# Patient Record
Sex: Female | Born: 1975 | Race: Black or African American | Hispanic: No | Marital: Single | Smoking: Never smoker
Health system: Southern US, Community
[De-identification: ages and names within clinical notes are randomized; demographics above are authoritative.]

## PROBLEM LIST (undated history)

## (undated) DIAGNOSIS — Z860100 Personal history of colon polyps, unspecified: Secondary | ICD-10-CM

## (undated) DIAGNOSIS — F329 Major depressive disorder, single episode, unspecified: Secondary | ICD-10-CM

## (undated) DIAGNOSIS — S83242A Other tear of medial meniscus, current injury, left knee, initial encounter: Secondary | ICD-10-CM

## (undated) DIAGNOSIS — K5909 Other constipation: Secondary | ICD-10-CM

## (undated) DIAGNOSIS — F32A Depression, unspecified: Secondary | ICD-10-CM

## (undated) DIAGNOSIS — N393 Stress incontinence (female) (male): Secondary | ICD-10-CM

## (undated) DIAGNOSIS — I10 Essential (primary) hypertension: Secondary | ICD-10-CM

## (undated) HISTORY — DX: Major depressive disorder, single episode, unspecified: F32.9

## (undated) HISTORY — DX: Other tear of medial meniscus, current injury, left knee, initial encounter: S83.242A

## (undated) HISTORY — DX: Depression, unspecified: F32.A

## (undated) HISTORY — DX: Essential (primary) hypertension: I10

---

## 2001-03-24 ENCOUNTER — Other Ambulatory Visit: Admission: RE | Admit: 2001-03-24 | Discharge: 2001-03-24 | Payer: Self-pay | Admitting: Obstetrics and Gynecology

## 2005-10-19 ENCOUNTER — Ambulatory Visit: Payer: Self-pay | Admitting: Internal Medicine

## 2006-04-17 ENCOUNTER — Ambulatory Visit: Payer: Self-pay | Admitting: Internal Medicine

## 2006-04-17 LAB — CONVERTED CEMR LAB
ALT: 14 units/L (ref 0–40)
AST: 20 units/L (ref 0–37)
Albumin: 3.7 g/dL (ref 3.5–5.2)
BUN: 8 mg/dL (ref 6–23)
Bilirubin, Direct: 0.1 mg/dL (ref 0.0–0.3)
Eosinophils Relative: 1.2 % (ref 0.0–5.0)
GFR calc Af Amer: 106 mL/min
Glucose, Bld: 97 mg/dL (ref 70–99)
HDL: 43.1 mg/dL (ref >39.0)
Hemoglobin: 12.5 g/dL (ref 12.0–15.0)
Lymphocytes Relative: 26.2 % (ref 12.0–46.0)
MCHC: 34 g/dL (ref 30.0–36.0)
Monocytes Absolute: 0.4 10*3/uL (ref 0.2–0.7)
Monocytes Relative: 5.5 % (ref 3.0–11.0)
Neutrophils Relative %: 66.2 % (ref 43.0–77.0)
Platelets: 295 10*3/uL (ref 150–400)
Potassium: 3.8 meq/L (ref 3.5–5.1)
Sodium: 140 meq/L (ref 135–145)
Total CHOL/HDL Ratio: 3.7
Total Protein: 7 g/dL (ref 6.0–8.3)
WBC: 7.8 10*3/uL (ref 4.5–10.5)

## 2006-09-22 LAB — CONVERTED CEMR LAB: Pap Smear: NORMAL

## 2007-06-03 ENCOUNTER — Emergency Department (HOSPITAL_COMMUNITY): Admission: EM | Admit: 2007-06-03 | Discharge: 2007-06-03 | Payer: Self-pay | Admitting: Emergency Medicine

## 2007-06-22 ENCOUNTER — Ambulatory Visit: Payer: Self-pay | Admitting: Internal Medicine

## 2007-06-22 DIAGNOSIS — F329 Major depressive disorder, single episode, unspecified: Secondary | ICD-10-CM

## 2007-06-22 DIAGNOSIS — F419 Anxiety disorder, unspecified: Secondary | ICD-10-CM | POA: Insufficient documentation

## 2007-06-22 DIAGNOSIS — K219 Gastro-esophageal reflux disease without esophagitis: Secondary | ICD-10-CM | POA: Insufficient documentation

## 2007-06-22 DIAGNOSIS — R319 Hematuria, unspecified: Secondary | ICD-10-CM | POA: Insufficient documentation

## 2007-06-22 DIAGNOSIS — F32A Depression, unspecified: Secondary | ICD-10-CM | POA: Insufficient documentation

## 2007-06-22 LAB — CONVERTED CEMR LAB
Bacteria, UA: NONE SEEN
Beta hcg, urine, semiquantitative: NEGATIVE
Bilirubin Urine: NEGATIVE
RBC / HPF: NONE SEEN (ref <3)
Specific Gravity, Urine: <1.005
WBC, UA: NONE SEEN cells/hpf (ref <3)

## 2007-06-23 ENCOUNTER — Encounter: Payer: Self-pay | Admitting: Internal Medicine

## 2007-06-28 ENCOUNTER — Telehealth (INDEPENDENT_AMBULATORY_CARE_PROVIDER_SITE_OTHER): Payer: Self-pay | Admitting: *Deleted

## 2007-06-28 LAB — CONVERTED CEMR LAB: Chlamydia, DNA Probe: NEGATIVE

## 2007-07-04 ENCOUNTER — Telehealth (INDEPENDENT_AMBULATORY_CARE_PROVIDER_SITE_OTHER): Payer: Self-pay | Admitting: *Deleted

## 2007-07-13 ENCOUNTER — Ambulatory Visit: Payer: Self-pay | Admitting: *Deleted

## 2007-07-13 ENCOUNTER — Encounter: Payer: Self-pay | Admitting: Internal Medicine

## 2007-08-16 ENCOUNTER — Ambulatory Visit: Payer: Self-pay | Admitting: Internal Medicine

## 2007-08-20 ENCOUNTER — Encounter (INDEPENDENT_AMBULATORY_CARE_PROVIDER_SITE_OTHER): Payer: Self-pay | Admitting: *Deleted

## 2007-08-20 ENCOUNTER — Encounter: Payer: Self-pay | Admitting: Internal Medicine

## 2007-08-20 LAB — CONVERTED CEMR LAB
Hemoglobin: 12.6 g/dL (ref 12.0–15.0)
TSH: 1.05 microintl units/mL (ref 0.35–5.50)

## 2008-03-07 ENCOUNTER — Ambulatory Visit: Payer: Self-pay | Admitting: Family Medicine

## 2008-03-07 DIAGNOSIS — R55 Syncope and collapse: Secondary | ICD-10-CM | POA: Insufficient documentation

## 2008-03-12 ENCOUNTER — Encounter (INDEPENDENT_AMBULATORY_CARE_PROVIDER_SITE_OTHER): Payer: Self-pay | Admitting: *Deleted

## 2008-05-22 LAB — CONVERTED CEMR LAB: Pap Smear: NORMAL

## 2008-05-26 ENCOUNTER — Ambulatory Visit: Payer: Self-pay | Admitting: Internal Medicine

## 2008-05-26 DIAGNOSIS — G4700 Insomnia, unspecified: Secondary | ICD-10-CM | POA: Insufficient documentation

## 2008-05-29 ENCOUNTER — Encounter: Payer: Self-pay | Admitting: Internal Medicine

## 2008-06-13 ENCOUNTER — Telehealth (INDEPENDENT_AMBULATORY_CARE_PROVIDER_SITE_OTHER): Payer: Self-pay | Admitting: *Deleted

## 2008-06-14 ENCOUNTER — Ambulatory Visit (HOSPITAL_COMMUNITY): Admission: RE | Admit: 2008-06-14 | Discharge: 2008-06-14 | Payer: Self-pay | Admitting: Urology

## 2008-06-20 ENCOUNTER — Telehealth: Payer: Self-pay | Admitting: Internal Medicine

## 2008-06-23 ENCOUNTER — Encounter: Payer: Self-pay | Admitting: Internal Medicine

## 2008-07-24 ENCOUNTER — Ambulatory Visit (HOSPITAL_BASED_OUTPATIENT_CLINIC_OR_DEPARTMENT_OTHER): Admission: RE | Admit: 2008-07-24 | Discharge: 2008-07-24 | Payer: Self-pay | Admitting: Urology

## 2008-07-24 ENCOUNTER — Encounter (INDEPENDENT_AMBULATORY_CARE_PROVIDER_SITE_OTHER): Payer: Self-pay | Admitting: Urology

## 2008-07-24 HISTORY — PX: OTHER SURGICAL HISTORY: SHX169

## 2008-07-31 ENCOUNTER — Ambulatory Visit (HOSPITAL_COMMUNITY): Admission: RE | Admit: 2008-07-31 | Discharge: 2008-07-31 | Payer: Self-pay | Admitting: Urology

## 2008-08-06 ENCOUNTER — Ambulatory Visit (HOSPITAL_COMMUNITY): Admission: RE | Admit: 2008-08-06 | Discharge: 2008-08-06 | Payer: Self-pay | Admitting: Urology

## 2008-09-08 ENCOUNTER — Telehealth (INDEPENDENT_AMBULATORY_CARE_PROVIDER_SITE_OTHER): Payer: Self-pay | Admitting: *Deleted

## 2008-10-21 ENCOUNTER — Ambulatory Visit: Payer: Self-pay | Admitting: Internal Medicine

## 2008-10-22 ENCOUNTER — Encounter: Payer: Self-pay | Admitting: Internal Medicine

## 2008-10-29 ENCOUNTER — Telehealth: Payer: Self-pay | Admitting: Internal Medicine

## 2008-10-29 LAB — CONVERTED CEMR LAB
ALT: 20 units/L (ref 0–35)
AST: 28 units/L (ref 0–37)
Eosinophils Absolute: 0.2 10*3/uL (ref 0.0–0.7)
HDL: 38 mg/dL — ABNORMAL LOW (ref >39.00)
LDL Cholesterol: 80 mg/dL (ref 0–99)
MCHC: 34.2 g/dL (ref 30.0–36.0)
Monocytes Relative: 4.9 % (ref 3.0–12.0)
Neutro Abs: 5 10*3/uL (ref 1.4–7.7)
Neutrophils Relative %: 74.6 % (ref 43.0–77.0)
Platelets: 283 10*3/uL (ref 150.0–400.0)
RBC: 3.92 M/uL (ref 3.87–5.11)
RDW: 11.8 % (ref 11.5–14.6)
Total CHOL/HDL Ratio: 4
VLDL: 18.8 mg/dL (ref 0.0–40.0)

## 2008-10-31 ENCOUNTER — Ambulatory Visit: Payer: Self-pay | Admitting: Internal Medicine

## 2008-11-05 LAB — CONVERTED CEMR LAB: Ferritin: 7.3 ng/mL — ABNORMAL LOW (ref 10.0–291.0)

## 2009-02-16 ENCOUNTER — Telehealth: Payer: Self-pay | Admitting: Internal Medicine

## 2009-02-26 ENCOUNTER — Ambulatory Visit (HOSPITAL_COMMUNITY): Admission: RE | Admit: 2009-02-26 | Discharge: 2009-02-26 | Payer: Self-pay | Admitting: Orthopedic Surgery

## 2009-02-26 HISTORY — PX: OTHER SURGICAL HISTORY: SHX169

## 2009-04-14 ENCOUNTER — Telehealth: Payer: Self-pay | Admitting: Internal Medicine

## 2009-04-15 ENCOUNTER — Telehealth: Payer: Self-pay | Admitting: Internal Medicine

## 2009-04-29 ENCOUNTER — Encounter: Payer: Self-pay | Admitting: Internal Medicine

## 2009-06-05 ENCOUNTER — Telehealth (INDEPENDENT_AMBULATORY_CARE_PROVIDER_SITE_OTHER): Payer: Self-pay | Admitting: *Deleted

## 2009-06-09 ENCOUNTER — Encounter: Payer: Self-pay | Admitting: Internal Medicine

## 2009-10-19 ENCOUNTER — Ambulatory Visit: Payer: Self-pay | Admitting: Internal Medicine

## 2009-10-19 DIAGNOSIS — K5900 Constipation, unspecified: Secondary | ICD-10-CM | POA: Insufficient documentation

## 2009-10-19 LAB — CONVERTED CEMR LAB
Blood in Urine, dipstick: NEGATIVE
Ketones, urine, test strip: NEGATIVE
Specific Gravity, Urine: 1.02

## 2009-10-29 ENCOUNTER — Ambulatory Visit: Payer: Self-pay | Admitting: Internal Medicine

## 2009-10-30 ENCOUNTER — Encounter (INDEPENDENT_AMBULATORY_CARE_PROVIDER_SITE_OTHER): Payer: Self-pay | Admitting: *Deleted

## 2009-11-13 ENCOUNTER — Ambulatory Visit: Payer: Self-pay | Admitting: Internal Medicine

## 2009-12-02 ENCOUNTER — Telehealth: Payer: Self-pay | Admitting: Internal Medicine

## 2009-12-04 ENCOUNTER — Encounter: Payer: Self-pay | Admitting: Internal Medicine

## 2009-12-04 ENCOUNTER — Ambulatory Visit: Payer: Self-pay | Admitting: Internal Medicine

## 2009-12-04 ENCOUNTER — Ambulatory Visit (HOSPITAL_COMMUNITY): Admission: RE | Admit: 2009-12-04 | Discharge: 2009-12-04 | Payer: Self-pay | Admitting: Internal Medicine

## 2009-12-07 ENCOUNTER — Encounter: Payer: Self-pay | Admitting: Internal Medicine

## 2010-01-19 ENCOUNTER — Telehealth: Payer: Self-pay | Admitting: Internal Medicine

## 2010-01-20 ENCOUNTER — Telehealth: Payer: Self-pay | Admitting: Internal Medicine

## 2010-01-20 ENCOUNTER — Encounter: Payer: Self-pay | Admitting: Internal Medicine

## 2010-03-21 LAB — CONVERTED CEMR LAB
ALT: 14 units/L (ref 0–35)
AST: 21 units/L (ref 0–37)
Alkaline Phosphatase: 49 units/L (ref 39–117)
BUN: 6 mg/dL (ref 6–23)
Basophils Absolute: 0.1 10*3/uL (ref 0.0–0.1)
Beta hcg, urine, semiquantitative: NEGATIVE
Bilirubin, Direct: 0.2 mg/dL (ref 0.0–0.3)
CO2: 24 meq/L (ref 19–32)
CO2: 27 meq/L (ref 19–32)
Calcium: 9.5 mg/dL (ref 8.4–10.5)
Chloride: 102 meq/L (ref 96–112)
Chloride: 103 meq/L (ref 96–112)
Creatinine, Ser: 0.82 mg/dL (ref 0.40–1.20)
Eosinophils Absolute: 0.1 10*3/uL (ref 0.0–0.7)
Glucose, Bld: 75 mg/dL (ref 70–99)
Glucose, Bld: 88 mg/dL (ref 70–99)
HCT: 38 % (ref 36.0–46.0)
Hemoglobin: 12.1 g/dL (ref 12.0–15.0)
Hemoglobin: 13 g/dL (ref 12.0–15.0)
Iron: 80 ug/dL (ref 42–145)
Lymphocytes Relative: 30.2 % (ref 12.0–46.0)
MCV: 91.3 fL (ref 78.0–100.0)
Monocytes Absolute: 0.6 10*3/uL (ref 0.1–1.0)
Monocytes Absolute: 0.8 10*3/uL (ref 0.1–1.0)
Monocytes Relative: 6.5 % (ref 3.0–12.0)
Neutro Abs: 4.2 10*3/uL (ref 1.7–7.7)
Neutro Abs: 5.8 10*3/uL (ref 1.4–7.7)
Neutrophils Relative %: 48 % (ref 43–77)
Platelets: 282 10*3/uL (ref 150–400)
Potassium: 3.9 meq/L (ref 3.5–5.1)
Potassium: 4 meq/L (ref 3.5–5.3)
RBC: 3.99 M/uL (ref 3.87–5.11)
RDW: 14.1 % (ref 11.5–14.6)
Saturation Ratios: 24.3 % (ref 20.0–50.0)
Sodium: 138 meq/L (ref 135–145)
Sodium: 138 meq/L (ref 135–145)
TSH: 1.05 microintl units/mL (ref 0.35–5.50)
Total Bilirubin: 1 mg/dL (ref 0.3–1.2)
Transferrin: 235.2 mg/dL (ref 212.0–360.0)
WBC: 8.7 10*3/uL (ref 4.0–10.5)

## 2010-03-23 NOTE — Assessment & Plan Note (Signed)
Summary: constipation--ch.   History of Present Illness Visit Type: consult  Primary GI MD: Lina Sar MD Primary Provider: Willow Ora, MD  Requesting Provider: Willow Ora, MD  Chief Complaint: Lower abd pain, bloating, rectal pain, constipation, and nausea History of Present Illness:   This is a 35 year old African American female c/o  constipation. She underwent  right hip surgery in January of this year and returned back to work in August. The first week at work she became very constipated and did not have a bowel movement for almost 3 weeks. She resorted to manual desimpaction, and subsequently was given lactulose 30 cc 3 times a day by Dr. Drue Novel. She had a satisfactory bowel movement 3 days ago but has not gone since then. Her level of activity has decreased since the hip surgery. She is a Runner, broadcasting/film/video. She occasionally has taken  Epsom salts. There is no family history of colon cancer or bowel problems. She has never had any rectal surgery or vaginal delivery.   GI Review of Systems    Reports abdominal pain, bloating, nausea, and  vomiting.     Location of  Abdominal pain: lower abdomen.    Denies acid reflux, belching, chest pain, dysphagia with liquids, dysphagia with solids, heartburn, loss of appetite, vomiting blood, weight loss, and  weight gain.      Reports constipation, fecal incontinence, rectal bleeding, and  rectal pain.     Denies anal fissure, black tarry stools, change in bowel habit, diarrhea, diverticulosis, heme positive stool, hemorrhoids, irritable bowel syndrome, jaundice, light color stool, and  liver problems.    Current Medications (verified): 1)  Lunesta 3 Mg Tabs (Eszopiclone) .Marland Kitchen.. 1 By Mouth At Bedtime As Needed 2)  Trazodone Hcl 50 Mg Tabs (Trazodone Hcl) .Marland Kitchen.. 1 By Mouth Two Times A Day or Three Times A Day 3)  Meloxicam 15 Mg Tabs (Meloxicam) .... Qd 4)  Analpram-Hc 1-2.5 % Crea (Hydrocortisone Ace-Pramoxine) .... Apply Twice A Day For One Week 5)  Lactulose 20  Gm/88ml Soln (Lactulose) .... 20 Cc 3 Times A Day As Needed For Constipation 6)  Vitamin D (Ergocalciferol) 50000 Unit Caps (Ergocalciferol) .... One Capsule By Mouth Once A Week  Allergies (verified): No Known Drug Allergies  Past History:  Past Medical History: GO, no BC Anxiety, Depression insomnia GERD Back  and hip pain since 10-2006 , WC related injury, s/p local injection 02-2008 and 04-2008 wrist pain since 2008, states was injured at work, Rx trazodone  Urinary Tract Infection Obesity  Past Surgical History: Reviewed history from 10/19/2009 and no changes required. 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 CAD - no HTN - M. Aunts stroke - no DM - M, M. 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  The patient denies allergy/sinus, anemia, blood in urine, breast changes/lumps, change in vision, confusion, cough, coughing up blood, fainting, fever,  hearing problems, heart murmur, heart rhythm changes, muscle pains/cramps, night sweats, pregnancy symptoms, shortness of breath, sore throat, swelling of feet/legs, swollen lymph glands, thirst - excessive , urination - excessive , urination changes/pain, and vision changes.         Pertinent positive and negative review of systems were noted in the above HPI. All other ROS was  otherwise negative.   Vital Signs:  Patient profile:   35 year old female Height:      64.75 inches Weight:      188 pounds BMI:     31.64 BSA:     1.92 Pulse rate:   68 / minute Pulse rhythm:   regular BP sitting:   124 / 76  (left arm) Cuff size:   regular  Vitals Entered By: Ok Anis CMA (November 13, 2009 9:32 AM)  Physical Exam  General:  Well developed, well nourished, no acute distress. Eyes:  PERRLA, no icterus. Mouth:  No deformity or lesions, dentition normal. Neck:  normal. Lungs:  Clear throughout to auscultation. Heart:  Regular rate and rhythm; no murmurs, rubs,  or bruits. Abdomen:  soft relaxed nontender abdomen without palpable stool or mass. Bowel sounds are normoactive. There is no distention. Liver edge at costal margin. Rectal:  normal perianal area, rectal tone is normal, there is a large amount of soft Hemoccult negative stool. Extremities:  No clubbing, cyanosis, edema or deformities noted. Skin:  Intact without significant lesions or rashes. Psych:  Alert and cooperative. Normal mood and affect.   Impression & Recommendations:  Problem # 1:  CONSTIPATION (ICD-564.00)  Patient has chronic constipation with a recent exascerbation most likely related to her recent hip surgery , inactivity. and pain meds. She had to have a fecal disimpaction. She could not tolerate lactulose because of its  taste. We will switch her to MiraLax 17 g every day or every other day. We have discussed the constipation and treatment. She will try to increase her level of activity. I have given her samples of Align  to take daily. Because of a concern for a possible structural abnormality of her colon such as diverticulosis or obstruction, we will go ahead with a colonoscopy. She will use MiraLax prep.  Orders: Colonoscopy (Colon)  Patient Instructions: 1)  continue high-fiber diet. 2)  MiraLax 17 g daily. 3)  Align one p.o. q.d. 4)  Colonoscopy, MiraLax prep. 5)   Increase activity. 6)  Copy sent to : Dr Drue Novel 7)  The medication list was reviewed and reconciled.  All changed / newly prescribed medications were explained.  A complete medication list was provided to the patient / caregiver. Prescriptions: MIRALAX  POWD (POLYETHYLENE GLYCOL 3350) Dissolve 17 grams (1 capful) in at least 8 ounces water/juice and drink once daily  #527 grams x 3   Entered by:   Lamona Curl CMA (AAMA)   Authorized by:   Hart Carwin MD   Signed by:   Lamona Curl CMA (AAMA) on 11/13/2009   Method used:   Electronically to        [facility] [phone]* (retail)       [address]       [place], [place]  [address]       Ph: [phone]       Fax: [phone]   RxID:   [id number] DULCOLAX 5 MG  TBEC (BISACODYL) Day before procedure take 2 at 3pm and 2 at 8pm.  #  4 x 0   Entered by:   Lamona Curl CMA (AAMA)   Authorized by:   Hart Carwin MD   Signed by:   Lamona Curl CMA (AAMA) on 11/13/2009   Method used:   Electronically to        [facility] [phone]* (retail)       [address]       [place], [place]  [address]       Ph: [phone]       Fax: [phone]   RxID:   [id number] REGLAN 10 MG  TABS (METOCLOPRAMIDE HCL) As per prep instructions.  #2 x 0   Entered by:   Lamona Curl CMA (AAMA)   Authorized by:   Hart Carwin MD   Signed by:   Lamona Curl CMA (AAMA) on 11/13/2009   Method used:   Electronically to        [facility] [phone]* (retail)       [address]       [place], [place]  [address]       Ph: [phone]       Fax: [phone]   RxID:   [id number] MIRALAX   POWD (POLYETHYLENE GLYCOL 3350) As per prep  instructions.  #255gm x 0   Entered by:   Lamona Curl CMA (AAMA)   Authorized by:   Hart Carwin MD   Signed by:   Lamona Curl CMA (AAMA) on 11/13/2009   Method used:    Electronically to        [facility] [phone]* (retail)       [address]       [place], [place]  [address]       Ph: [phone]       Fax: [phone]   RxID:   [id number]

## 2010-03-23 NOTE — Medication Information (Signed)
Summary: Prior Authorization & Approval for Additional Quantity Lunesta/M  Prior Authorization & Approval for Additional Quantity Lunesta/[employer]   Imported By: Lanelle Bal 06/15/2009 13:49:17  _____________________________________________________________________  External Attachment:    Type:   Image     Comment:   External Document

## 2010-03-23 NOTE — Progress Notes (Signed)
Summary: refill  Phone Note Refill Request Message from:  Fax from Pharmacy on January 19, 2010 8:57 AM  Refills Requested: Medication #1:  LUNESTA 3 MG TABS 1 by mouth at bedtime as needed Ivar Bury -fax [phone]  Initial call taken by: Okey Regal [place],  January 19, 2010 8:57 AM  Follow-up for Phone Call        30, 6 refills Follow-up by: [facility] E. Zackariah Vanderpol MD,  January 19, 2010 4:48 PM    Prescriptions: LUNESTA 3 MG TABS (ESZOPICLONE) 1 by mouth at bedtime as needed  #30 x 6   Entered by:   Army Fossa CMA   Authorized by:   Nolon Rod. Eathel Pajak MD   Signed by:   Army Fossa CMA on 01/19/2010   Method used:   Printed then faxed to ...       [facility] [phone]* (retail)       [address]       [place], [place]  [address]       Ph: [phone]       Fax: [phone]   RxID:   [id number]

## 2010-03-23 NOTE — Progress Notes (Signed)
Summary: Prior auth APPROVED for Lunesta  Phone Note Call from Patient Call back at [facility] Phone [phone]   Caller: Patient Summary of Call: PATIENT DROPPED OFF LETTER FROM [employer] REGARDING RENEWAL OF LUNESTA 3 MG --COVERAGE EXPIRES 06/13/2009  PLEASE REVIEW AND CALL PATIENT AT [phone] WITH RESPONSE  WILL TAKE LETTER TO [facility] IN A PLASTIC SLEEVE Initial call taken by: Jerolyn Shin,  June 05, 2009 4:16 PM  Follow-up for Phone Call        A review of our records shows that approval for coverage of the medication Lunesta 3 mg is due to expire on 06/13/09.  [employer] called and prior  auth in process   CASE WJ#[id number].Kandice Hams  June 08, 2009 3:43 PM      Additional Follow-up for Phone Call Additional follow up Details #1::        Prior auth APPROVED  from 05/19/09 to 06/09/2010--[employer] Additional Follow-up by: Kandice Hams,  June 10, 2009 8:59 AM

## 2010-03-23 NOTE — Letter (Signed)
Summary: [facility] Instructions  [facility]  [address] [place], [place] [address]   Phone: [phone]  Fax: [phone]       Teresa Gilbert    06-Jun-1975    MRN: [id number]       Procedure Day /Date: Friday 11/27/09     Arrival Time: 12:30 pm     Procedure Time: 1:30 pm     Location of Procedure:                    _x _  [facility] (4th Floor)  PREPARATION FOR COLONOSCOPY WITH MIRALAX  Starting 5 days prior to your procedure 11/22/09 do not eat nuts, seeds, popcorn, corn, beans, peas,  salads, or any raw vegetables.  Do not take any fiber supplements (e.g. Metamucil, Citrucel, and Benefiber). ____________________________________________________________________________________________________   THE DAY BEFORE YOUR PROCEDURE         DATE:  11/26/09 DAY: Thursday  1   Drink clear liquids the entire day-NO SOLID FOOD  2   Do not drink anything colored red or purple.  Avoid juices with pulp.  No orange juice.  3   Drink at least 64 oz. (8 glasses) of fluid/clear liquids during the day to prevent dehydration and help the prep work efficiently.  CLEAR LIQUIDS INCLUDE: Water Jello Ice Popsicles Tea (sugar ok, no milk/cream) Powdered fruit flavored drinks Coffee (sugar ok, no milk/cream) Gatorade Juice: apple, white grape, white cranberry  Lemonade Clear bullion, consomm, broth Carbonated beverages (any kind) Strained chicken noodle soup Hard Candy  4   Mix the entire bottle of Miralax with 64 oz. of Gatorade/Powerade in the morning and put in the refrigerator to chill.  5   At 3:00 pm take 2 Dulcolax/Bisacodyl tablets.  6   At 4:30 pm take one Reglan/Metoclopramide tablet.  7  Starting at 5:00 pm drink one 8 oz glass of the Miralax mixture every 15-20 minutes until you have finished drinking the entire 64 oz.  You should finish drinking prep around 7:30 or 8:00 pm.  8   If you are nauseated, you may take the 2nd Reglan/Metoclopramide  tablet at 6:30 pm.        9    At 8:00 pm take 2 more DULCOLAX/Bisacodyl tablets.         THE DAY OF YOUR PROCEDURE      DATE:  11/27/09 DAY: Friday  You may drink clear liquids until 11:30 am (2 HOURS BEFORE PROCEDURE).   MEDICATION INSTRUCTIONS  Unless otherwise instructed, you should take regular prescription medications with a small sip of water as early as possible the morning of your procedure.       OTHER INSTRUCTIONS  You will need a responsible adult at least 37 years of age to accompany you and drive you home.   This person must remain in the waiting room during your procedure.  Wear loose fitting clothing that is easily removed.  Leave jewelry and other valuables at home.  However, you may wish to bring a book to read or an iPod/MP3 player to listen to music as you wait for your procedure to start.  Remove all body piercing jewelry and leave at home.  Total time from sign-in until discharge is approximately 2-3 hours.  You should go home directly after your procedure and rest.  You can resume normal activities the day after your procedure.  The day of your procedure you should not:   Drive   Make  legal decisions   Operate machinery   Drink alcohol   Return to work  You will receive specific instructions about eating, activities and medications before you leave.   The above instructions have been reviewed and explained to me by  Lamona Curl CMA Duncan Dull)  November 13, 2009 10:44 AM     I fully understand and can verbalize these instructions _____________________________ Date 11/13/09

## 2010-03-23 NOTE — Letter (Signed)
Summary: Patient Notice- Polyp Results  [facility]  [address] [place], [place] [address]   Phone: [phone]  Fax: [phone]        December 07, 2009 MRN: [id number]    [facility] [address] [place], [place]  [address]    Dear Ms. Razavi,  I am pleased to inform you that the colon polyp(s) removed during your recent colonoscopy was (were) found to be benign (no cancer detected) upon pathologic examination.The polyp was hyperplastic ( not precancerous)  I recommend you have a repeat colonoscopy examination in 10_ years to look for recurrent polyps, as having colon polyps increases your risk for having recurrent polyps or even colon cancer in the future.  Should you develop new or worsening symptoms of abdominal pain, bowel habit changes or bleeding from the rectum or bowels, please schedule an evaluation with either your primary care physician or with me.  Additional information/recommendations:  _x_ No further action with gastroenterology is needed at this time. Please      follow-up with your primary care physician for your other healthcare      needs.  __ Please call [phone] to schedule a return visit to review your      situation.  __ Please keep your follow-up visit as already scheduled.  __ Continue treatment plan as outlined the day of your exam.  Please call us if you are having persistent problems or have questions about your condition that have not been fully answered at this time.  Sincerely,  Hart Carwin MD  This letter has been electronically signed by your physician.  Appended Document: Patient Notice- Polyp Results Letter sent to patient.

## 2010-03-23 NOTE — Assessment & Plan Note (Signed)
Summary: cpx/fasting//kn   Vital Signs:  Patient profile:   35 year old female Height:      64.75 inches Weight:      193.13 pounds Pulse rate:   75 / minute Pulse rhythm:   regular BP sitting:   116 / 80  (left arm) Cuff size:   large  Vitals Entered By: Army Fossa CMA (October 29, 2009 9:44 AM) CC: CPX, fasting Comments declines flu shot [facility] [address]   History of Present Illness: CPX   Current Medications (verified): 1)  Lunesta 3 Mg Tabs (Eszopiclone) .Marland Kitchen.. 1 By Mouth At Bedtime As Needed 2)  Trazodone Hcl 50 Mg Tabs (Trazodone Hcl) .Marland Kitchen.. 1 By Mouth Daily 3)  Meloxicam 15 Mg Tabs (Meloxicam) .... Qd 4)  Phentermine Hcl 37.5 Mg Caps (Phentermine Hcl) .Marland Kitchen.. 1 A Day, Started 07-21-09, Rx At [facility] 5)  Analpram-Hc 1-2.5 % Crea (Hydrocortisone Ace-Pramoxine) .... Apply Twice A Day For One Week 6)  Lactulose 20 Gm/59ml Soln (Lactulose) .... 20 Cc 3 Times A Day As Needed For Constipation 7)  Vitamin D  Allergies (verified): No Known Drug Allergies  Past History:  Past Medical History: GO, no BC Anxiety, Depression insomnia GERD Back  and hip pain since 10-2006 , WC related injury, s/p local injection 02-2008 and 04-2008 wrist pain since 2008, states was injured at work, Rx trazodone   Past Surgical History: Reviewed history from 10/19/2009 and no changes required. 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 CAD - no HTN - M. Aunts stroke - no DM - M, M. Aunt, Uncle, cousin colon Ca - PGM, MGM (Dx in the 68s ant 44s) breast Ca - no dementia - PGF Lung cancer-- GF, G uncle   Social History: Reviewed history from 10/21/2008 and no changes required. 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 No GERD symptoms Chest pain seems to be triggered by anxiety, is not exertional. Resp:  Denies cough and shortness of breath. GI:  Denies diarrhea, nausea, and vomiting; no blood in the stools , occasionally blood in the toilete paper. She was seen with constipation, she discontinue Ultram-phentermine-VESIcare. No further problems with impactation but she continue  to feel constipated.. GU:  Denies dysuria and hematuria. Psych:  occasional anxiety.  Physical Exam  General:  alert and well-developed.   Neck:  no masses, no thyromegaly, and normal carotid upstroke.   Lungs:  normal respiratory effort, no intercostal retractions, no accessory muscle use, and normal breath sounds.   Heart:  normal rate, regular rhythm, no murmur, and no gallop.   Abdomen:  soft, non-tender, no distention, no masses, no guarding, and no rigidity.   Extremities:  no lower extremity edema Psych:  Cognition and judgment appear intact. Alert and cooperative with normal attention span and concentration.  not anxious appearing and not depressed appearing.     Impression & Recommendations:  Problem # 1:  HEALTH SCREENING (ICD-V70.0)  Td 2008  flu shot today female care per gyn again discussed diet and exercise  Labs atypical chest pain, EKG today  showed sinus rhythm, no acute changes, no changes compared to previous EKG  Orders: Venipuncture (54098) TLB-BMP (Basic Metabolic Panel-BMET) (80048-METABOL) TLB-CBC Platelet - w/Differential (85025-CBCD) TLB-Hepatic/Liver Function Pnl (80076-HEPATIC) TLB-Lipid Panel (80061-LIPID) TLB-TSH (Thyroid Stimulating Hormone) (84443-TSH) TLB-IBC Pnl (Iron/FE;Transferrin) (83550-IBC) EKG w/ Interpretation (93000) Specimen Handling (11914)  Problem # 2:   CONSTIPATION (ICD-564.00)  persistent constipation, onset about a year ago. had a recent impactation recommend Metamucil 2 capsules daily with lots of fluids. Colace daily.  Refer to GI if no better She sees blood sometimes in the toilet paper, that is likely unrelated to her colon/constipation ADDENDUM: likes a GI referal, will do  Her updated medication list for this problem includes:    Lactulose 20 Gm/30ml Soln (Lactulose) .Marland Kitchen... 20 cc 3 times a day as needed for constipation  Orders: Gastroenterology Referral (GI)  Problem # 3:  * PAIN MANAGMENT on trazodone for wrist pain will call for trazodone RF when needed  thinks it helps to some extent   Complete Medication List: 1)  Lunesta 3 Mg Tabs (Eszopiclone) .Marland Kitchen.. 1 by mouth at bedtime as needed 2)  Trazodone Hcl 50 Mg Tabs (Trazodone hcl) .Marland Kitchen.. 1 by mouth daily 3)  Meloxicam 15 Mg Tabs (Meloxicam) .... Qd 4)  Analpram-hc 1-2.5 % Crea (Hydrocortisone ace-pramoxine) .... Apply twice a day for one week 5)  Lactulose 20 Gm/87ml Soln (Lactulose) .... 20 cc 3 times a day as needed for constipation 6)  Vitamin D   Other Orders: Admin 1st Vaccine (78295) Flu Vaccine 69yrs + (62130)  Patient Instructions: 1)  Metamucil 2 capsules daily with lots of fluid 2)  Colace 50 mg daily 3)  Please schedule a follow-up appointment in 6 months .    Risk Factors:  PAP Smear History:     Date of Last PAP Smear:  04/21/2009    Results:  normal- per pt     Preventive Care Screening  Pap Smear:    Date:  04/21/2009    Results:  normal- per pt  Flu Vaccine Consent Questions     Do you have a history of severe allergic reactions to this vaccine? no    Any prior history of allergic reactions to egg and/or gelatin? no    Do you have a sensitivity to the preservative Thimersol? no    Do you have a past history of Guillan-Barre Syndrome? no    Do you currently have an acute febrile illness? no    Have you ever had a severe reaction to latex?  no    Vaccine information given and explained to patient? yes    Are you currently pregnant? no    Lot Number:[id number]   Exp Date:08/21/2010   Site Given  Right Deltoid IM Pap Smear:    Date:  04/21/2009    Results:  normal- per pt     .lbflu

## 2010-03-23 NOTE — Letter (Signed)
Summary: [facility]  [facility]   Imported By: Lanelle Bal 05/07/2009 10:41:31  _____________________________________________________________________  External Attachment:    Type:   Image     Comment:   External Document

## 2010-03-23 NOTE — Procedures (Signed)
Summary: Instructions for procedure/[place]  Instructions for procedure/[place]   Imported By: Sherian Rein 11/26/2009 12:12:22  _____________________________________________________________________  External Attachment:    Type:   Image     Comment:   External Document

## 2010-03-23 NOTE — Letter (Signed)
Summary: New Patient letter  [facility]  [address] [place], [place] [address]   Phone: [phone]  Fax: [phone]       10/30/2009 MRN: [id number]  [facility] [address] [place], [place]  [address]  Dear Ms. Kestler,  Welcome to the Gastroenterology Division at [facility].    You are scheduled to see Dr.  Juanda Chance on 11-13-09 at 9:15a.m. on the 3rd floor at [facility], [address]  We ask that you try to arrive at our office 15 minutes prior to your appointment time to allow for check-in.  We would like you to complete the enclosed self-administered evaluation form prior to your visit and bring it with you on the day of your appointment.  We will review it with you.  Also, please bring a complete list of all your medications or, if you prefer, bring the medication bottles and we will list them.  Please bring your insurance card so that we may make a copy of it.  If your insurance requires a referral to see a specialist, please bring your referral form from your primary care physician.  Co-payments are due at the time of your visit and may be paid by cash, check or credit card.     Your office visit will consist of a consult with your physician (includes a physical exam), any laboratory testing he/she may order, scheduling of any necessary diagnostic testing (e.g. x-ray, ultrasound, CT-scan), and scheduling of a procedure (e.g. Endoscopy, Colonoscopy) if required.  Please allow enough time on your schedule to allow for any/all of these possibilities.    If you cannot keep your appointment, please call [phone] to cancel or reschedule prior to your appointment date.  This allows [place] the opportunity to schedule an appointment for another patient in need of care.  If you do not cancel or reschedule by 5 p.m. the business day prior to your appointment date, you will be charged a $50.00 late cancellation/no-show fee.    Thank you for  choosing [facility] for your medical needs.  We appreciate the opportunity to care for you.  Please visit [place] at our website  to learn more about our practice.                     Sincerely,                                                             The Gastroenterology Division

## 2010-03-23 NOTE — Procedures (Signed)
Summary: Colonoscopy  Patient: Teresa Gilbert Note: All result statuses are Final unless otherwise noted.  Tests: (1) Colonoscopy (COL)   COL Colonoscopy           DONE     [facility]     [address] [place], [place]  [address]           COLONOSCOPY PROCEDURE REPORT           PATIENT:  Teresa Gilbert, Teresa Gilbert  MR#:  [id number]     BIRTHDATE:  December 14, 1975, 34 yrs. old  GENDER:  female     ENDOSCOPIST:  Hedwig Morton. Juanda Chance, MD     REF. BY:  Willow Ora, M.D.     PROCEDURE DATE:  12/04/2009     PROCEDURE:  Colonoscopy [address]     ASA CLASS:  Class I     INDICATIONS:  change in bowel habits, constipation change in bowl     habits following hip surgery, refractory to Lactulose     MEDICATIONS:   Versed 10 mg, Fentanyl 100 mcg           DESCRIPTION OF PROCEDURE:   After the risks benefits and     alternatives of the procedure were thoroughly explained, informed     consent was obtained.  Digital rectal exam was performed and     revealed no rectal masses.   The  endoscope was introduced through     the anus and advanced to the cecum, which was identified by both     the appendix and ileocecal valve, without limitations.  The     quality of the prep was good, using MiraLax.  The instrument was     then slowly withdrawn as the colon was fully examined.           FINDINGS:  A diminutive polyp was found in the sigmoid colon. at     20 cm 2 mm polyp The polyp was removed using cold biopsy forceps.     Retroflexed views in the rectum revealed no abnormalities.    The     scope was then withdrawn from the patient and the procedure     completed.           COMPLICATIONS:  None     ENDOSCOPIC IMPRESSION:     1) Diminutive polyp in the sigmoid colon     2) Normal colonoscopy     RECOMMENDATIONS:     1) high fiber diet     2) Await pathology results     continue Miralax 17 gm daily     continue to increase Your activity     REPEAT EXAM:  In 10 year(s) for.        ______________________________     Hedwig Morton. Juanda Chance, MD           CC:           n.     eSIGNED:   Hedwig Morton. Brodie at 12/04/2009 10:29 AM           Debroah Loop, [phone]  Note: An exclamation mark (!) indicates a result that was not dispersed into the flowsheet. Document Creation Date: 12/04/2009 10:30 AM _______________________________________________________________________  (1) Order result status: Final Collection or observation date-time: 12/04/2009 10:23 Requested date-time:  Receipt date-time:  Reported date-time:  Referring Physician:   Ordering Physician: Lina Sar [id number]) Specimen Source:  Source: Launa Grill Order Number: [id number] Lab site:   Appended Document:  Colonoscopy    Clinical Lists Changes  Observations: Added new observation of COLONNXTDUE: 11/22/2019 (12/04/2009 14:30)

## 2010-03-23 NOTE — Progress Notes (Signed)
Summary: ?lunesta  Phone Note Call from Patient Call back at [phone]   Summary of Call: Patient states that she did have a refill left on her Alfonso Patten but she had transferred rx to [facility] because she was there recovering from surgery.  [facility] to refill x 1 ? Shary Decamp  April 15, 2009 1:00 PM   Follow-up for Phone Call        yes please  Follow-up by: [facility] E. Esley Brooking MD,  April 16, 2009 11:36 AM  Additional Follow-up for Phone Call Additional follow up Details #1::        patient aware prescription ready for pick up.......Marland KitchenDoristine Devoid  April 16, 2009 11:53 AM     Prescriptions: LUNESTA 3 MG TABS (ESZOPICLONE) 1 by mouth at bedtime as needed  #30 x 1   Entered by:   Doristine Devoid   Authorized by:   Nolon Rod. Dewayne Jurek MD   Signed by:   Doristine Devoid on 04/16/2009   Method used:   Telephoned to ...       [facility] [phone]* (retail)       [address]       [place], [place]  [address]       Ph: [phone]       Fax: [phone]   RxID:   [id number]

## 2010-03-23 NOTE — Progress Notes (Signed)
Summary: refill  Phone Note Refill Request Message from:  Fax from Pharmacy on April 14, 2009 4:05 PM  Refills Requested: Medication #1:  LUNESTA 3 MG TABS 1 by mouth at bedtime as needed. [facility] [address] [facility] [phone]   Method Requested: Fax to Local Pharmacy Next Appointment Scheduled: no appt Initial call taken by: Barb Merino,  April 14, 2009 4:06 PM  Follow-up for Phone Call        was rx'd #02/16/09 #30 with 3 refills, last ov 10/21/08 Shary Decamp  April 14, 2009 4:11 PM too early isn't it? Junice Fei E. Ritik Stavola MD  April 14, 2009 5:07 PM  yes, pt should not be due for refill until 05/17/09, will notify pharmacy [facility]  April 15, 2009 9:01 AM

## 2010-03-23 NOTE — Assessment & Plan Note (Signed)
Summary: STOMACH PROBLEMS/KN   Vital Signs:  Patient profile:   35 year old female Height:      64.75 inches Weight:      197.25 pounds BMI:     33.20 Pulse rate:   104 / minute Pulse rhythm:   regular BP sitting:   120 / 84  (left arm) Cuff size:   large  Vitals Entered By: Army Fossa CMA (October 19, 2009 2:41 PM) CC: Stomach Problems?? Comments - Has been constiapted- not had a BM on her own for 2-3 weeks. - Nauseas - Vomitted 2x.   History of Present Illness: history of constipation for about a year Symptoms become more noticeable for one week A week ago had some nausea 5 days ago vomited once 3 days ago she could not go to have a BM even after a suppository, she ended up checking her rectum and manually extracted a good amount of hard stools, she did have some bleeding. Since then, she  has not have a normal BM, she is having few hard  pieces of stools on and off. Yesterday she had some difficulty urinating  ROS Denies fevers No further nausea vomiting No gross hematuria all the blood seen per rectum was bright red  Some headaches She is taking Tylenol and tramadol for pain. In the last 7 days, she has taken 3 pills of Ultram 50 mg She also started phentermine Jul 21, 2009  Current Medications (verified): 1)  Lunesta 3 Mg Tabs (Eszopiclone) .Marland Kitchen.. 1 By Mouth At Bedtime As Needed 2)  Trazodone Hcl 50 Mg Tabs (Trazodone Hcl) .Marland Kitchen.. 1 By Mouth Daily 3)  Meloxicam 15 Mg Tabs (Meloxicam) .... Qd  Allergies (verified): No Known Drug Allergies  Past History:  Past Medical History: Reviewed history from 10/21/2008 and no changes required. GO, no BC Anxiety, Depression GERD Back  and hip pain since 10-2006 , WC related injury, s/p local injection 02-2008 and 04-2008  Past Surgical History: urethral diverticulum (dr Patsi Sears 07/24/2008) ***R*** Hip arthroscopy 02-2009   Social History: Reviewed history from 10/21/2008 and no changes required. Single teacher,  special ED  Never Smoked Alcohol use-no Drug use-no Regular exercise- very limite  diet-- trying to eat healthy   Physical Exam  General:  alert and well-developed.   Abdomen:  soft, non-tender, no distention, no masses, no guarding, and no rigidity.   Rectal:  has a single, nontender, 1 cm external hemorrhoid. Normal sphincter tone. No rectal masses or tenderness. anoscopy : No mass or polyp, few small internal hemorrhoids, some erythema, no active bleeding   Impression & Recommendations:  Problem # 1:  CONSTIPATION (ICD-564.00) chronic constipation  for one year and recent exhacerbation w/  impactation symptoms may be related or exacerbated by VESIcare and/or  phentermine and/or Ultram plan: analpram two times a day x 1 week Metamucil 2 tablets daily with fluids lactulose 20 cc 2 times a day as needed for constipation  Discontinue Ultram Discussed with her other providers about VESIcare and phentermine if symptoms continue, she see more blood in the stools she needs to call for a GI referral reassess when she returns to the office next month for her physical  Her updated medication list for this problem includes:    Lactulose 20 Gm/70ml Soln (Lactulose) .Marland Kitchen... 20 cc 3 times a day as needed for constipation  Orders: UA Dipstick w/o Micro (automated)  (81003) Anoscopy ([address])  Complete Medication List: 1)  Lunesta 3 Mg Tabs (Eszopiclone) .Marland Kitchen.. 1 by mouth at  bedtime as needed 2)  Trazodone Hcl 50 Mg Tabs (Trazodone hcl) .Marland Kitchen.. 1 by mouth daily 3)  Meloxicam 15 Mg Tabs (Meloxicam) .... Qd 4)  Phentermine Hcl 37.5 Mg Caps (Phentermine hcl) .Marland Kitchen.. 1 a day, started 07-21-09, rx at [facility] 5)  Analpram-hc 1-2.5 % Crea (Hydrocortisone ace-pramoxine) .... Apply twice a day for one week 6)  Lactulose 20 Gm/36ml Soln (Lactulose) .... 20 cc 3 times a day as needed for constipation 7)  Vesicare 5 Mg Tabs (Solifenacin succinate) .... Qd 8)  Vitamin D   Patient  Instructions: 1)  analpram two times a day x 1 week 2)  Metamucil 2 tablets daily with fluids 3)  lactulose 20 cc  3  times a day as needed for constipation  4)  Discontinue Ultram 5)  call if you're not better in few days or if you have further bleeding Prescriptions: LACTULOSE 20 GM/30ML SOLN (LACTULOSE) 20 cc 3 times a day as needed for constipation  #900cc x 1   Entered and Authorized by:   Elita Quick E. Kade Demicco MD   Signed by:   Nolon Rod. Aurorah Schlachter MD on 10/19/2009   Method used:   Electronically to        [facility] [phone]* (retail)       [address]       [place], [place]  [address]       Ph: [phone]       Fax: [phone]   RxID:   [id number] ANALPRAM-HC 1-2.5 % CREA (HYDROCORTISONE ACE-PRAMOXINE) Apply twice a day for one week  #1 x 0   Entered and Authorized by:   Nolon Rod. Tammy Wickliffe MD   Signed by:   Nolon Rod. Deanndra Kirley MD on 10/19/2009   Method used:   Electronically to        [facility] [phone]* (retail)       [address]       [place], [place]  [address]       Ph: [phone]       Fax: [phone]   RxID:   [id number]   Laboratory Results   Urine Tests    Routine Urinalysis   Color: yellow Appearance: Clear Glucose: negative   (Normal Range: Negative) Bilirubin: negative   (Normal Range: Negative) Ketone: negative   (Normal Range: Negative) Spec. Gravity: 1.020   (Normal Range: 1.003-1.035) Blood: negative   (Normal Range: Negative) pH: 7.0   (Normal Range: 5.0-8.0) Protein: negative   (Normal Range: Negative) Urobilinogen: 0.2   (Normal Range: 0-1) Nitrite: negative   (Normal Range: Negative) Leukocyte Esterace: negative   (Normal Range: Negative)    Comments: Vollie Aaron CMA  October 19, 2009 2:44 PM

## 2010-03-23 NOTE — Procedures (Signed)
Summary: Colonoscopy / Teresa Gilbert  Colonoscopy / Teresa Gilbert   Imported By: Lennie Odor 12/18/2009 12:14:49  _____________________________________________________________________  External Attachment:    Type:   Image     Comment:   External Document

## 2010-03-23 NOTE — Progress Notes (Signed)
Summary: Veatrice Bourbon PA  Phone Note Refill Request Message from:  Fax from Pharmacy on January 20, 2010 8:28 AM  Refills Requested: Medication #1:  LUNESTA 3 MG TABS 1 by mouth at bedtime as needed prior auth - [id number] - id [id number]   Initial call taken by: Okey Regal [place],  January 20, 2010 8:29 AM  Follow-up for Phone Call        Forms requested. Lucious Groves CMA  January 20, 2010 9:33 AM   Forms completed and faxed, will await insurance company reply. Lucious Groves CMA  January 20, 2010 10:11 AM      Appended Document: Lunest PA Approved until 01/20/11.

## 2010-03-23 NOTE — Progress Notes (Signed)
Summary: prep ?'s  Phone Note Call from Patient Call back at [phone]   Caller: Patient Call For: Dr. Juanda Chance Reason for Call: Talk to Nurse Summary of Call: prep ?'s Initial call taken by: Vallarie Mare,  December 02, 2009 9:23 AM  Follow-up for Phone Call        questioned whether it is okay for her to come for her procedure even though she ate some nuts and beans and had a recent hip injection. Advised her to come for her procedure as scheduled. Follow-up by: Lamona Curl CMA Duncan Dull),  December 02, 2009 10:05 AM

## 2010-03-23 NOTE — Medication Information (Signed)
Summary: Prior Authorization for Lunesta/[employer]  Prior Authorization for Lunesta/[employer]   Imported By: Lanelle Bal 01/28/2010 13:21:04  _____________________________________________________________________  External Attachment:    Type:   Image     Comment:   External Document

## 2010-03-25 NOTE — Medication Information (Signed)
Summary: Approval for Lunesta/[employer]  Approval for Lunesta/[employer]   Imported By: Lanelle Bal 01/28/2010 14:26:09  _____________________________________________________________________  External Attachment:    Type:   Image     Comment:   External Document

## 2010-04-30 ENCOUNTER — Other Ambulatory Visit: Payer: Self-pay | Admitting: Obstetrics

## 2010-04-30 ENCOUNTER — Ambulatory Visit (INDEPENDENT_AMBULATORY_CARE_PROVIDER_SITE_OTHER): Payer: BC Managed Care – PPO | Admitting: Internal Medicine

## 2010-04-30 ENCOUNTER — Encounter: Payer: Self-pay | Admitting: Internal Medicine

## 2010-04-30 DIAGNOSIS — M79609 Pain in unspecified limb: Secondary | ICD-10-CM

## 2010-04-30 DIAGNOSIS — M2550 Pain in unspecified joint: Secondary | ICD-10-CM | POA: Insufficient documentation

## 2010-04-30 DIAGNOSIS — E663 Overweight: Secondary | ICD-10-CM | POA: Insufficient documentation

## 2010-04-30 DIAGNOSIS — G4700 Insomnia, unspecified: Secondary | ICD-10-CM

## 2010-04-30 DIAGNOSIS — K5900 Constipation, unspecified: Secondary | ICD-10-CM

## 2010-05-09 LAB — CBC
HCT: 38.8 % (ref 36.0–46.0)
Hemoglobin: 13.1 g/dL (ref 12.0–15.0)

## 2010-05-09 LAB — URINALYSIS, ROUTINE W REFLEX MICROSCOPIC
Nitrite: NEGATIVE
Specific Gravity, Urine: 1.018 (ref 1.005–1.030)
pH: 6 (ref 5.0–8.0)

## 2010-05-09 LAB — COMPREHENSIVE METABOLIC PANEL
ALT: 16 U/L (ref 0–35)
AST: 21 U/L (ref 0–37)
BUN: 9 mg/dL (ref 6–23)
CO2: 27 mEq/L (ref 19–32)
Calcium: 9.5 mg/dL (ref 8.4–10.5)
Creatinine, Ser: 0.79 mg/dL (ref 0.4–1.2)
Total Bilirubin: 1 mg/dL (ref 0.3–1.2)
Total Protein: 7.6 g/dL (ref 6.0–8.3)

## 2010-05-09 LAB — PROTIME-INR: Prothrombin Time: 12.9 seconds (ref 11.6–15.2)

## 2010-05-09 LAB — URINE MICROSCOPIC-ADD ON

## 2010-05-09 LAB — APTT: aPTT: 32 seconds (ref 24–37)

## 2010-05-09 LAB — PREGNANCY, URINE: Preg Test, Ur: NEGATIVE

## 2010-05-10 ENCOUNTER — Other Ambulatory Visit: Payer: Self-pay | Admitting: Obstetrics

## 2010-05-11 NOTE — Assessment & Plan Note (Signed)
Summary: rto 6 months/cbs   Vital Signs:  Patient profile:   35 year old female Weight:      190.38 pounds Pulse rate:   70 / minute Pulse rhythm:   regular BP sitting:   126 / 72  (left arm) Cuff size:   regular  Vitals Entered By: Army Fossa CMA (April 30, 2010 3:03 PM) CC: 6 month f/u- no concerns Comments not fasting  [facility] [place]    History of Present Illness:  here for a  mid-year checkup No new complains  ROS She had an extensive evaluation by GI  for chronic constipation, eventually prescribed MiraLax, has not noticed any major improvement.  takes Lunesta for  insomnia, feels "okay" she also takes  trazodone which was initiated by orthopedic surgery for hand and wrist pain.  she is taking on and off phentermine since last year (at least) Depression is relatively well-controlled, no suicidal ideas, very seldom has crying spells.  Current Medications (verified): 1)  Lunesta 3 Mg Tabs (Eszopiclone) .Marland Kitchen.. 1 By Mouth At Bedtime As Needed 2)  Trazodone Hcl 50 Mg Tabs (Trazodone Hcl) .Marland Kitchen.. 1 By Mouth Two Times A Day or Three Times A Day 3)  Meloxicam 15 Mg Tabs (Meloxicam) .... Qd 4)  Miralax  Powd (Polyethylene Glycol 3350) .... Dissolve 17 Grams (1 Capful) in At Least 8 Ounces Water/juice and Drink Once Daily 5)  Phentermine Hcl 37.5 Mg Caps (Phentermine Hcl) .... Qd  Allergies (verified): No Known Drug Allergies  Past History:  Past Medical History: Reviewed history from 11/13/2009 and no changes required. GO, no BC Anxiety, Depression insomnia GERD Back  and hip pain since 10-2006 , WC related injury, s/p local injection 02-2008 and 04-2008 wrist pain since 2008, states was injured at work, Rx trazodone  Urinary Tract Infection Obesity  Past Surgical History: Reviewed history from 10/19/2009 and no changes required. 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   Lungs:   normal respiratory effort, no intercostal retractions, no accessory muscle use, and normal breath sounds.   Heart:  normal rate, regular rhythm, no murmur, and no gallop.   Extremities:  no lower extremity edema Psych:   not anxious appearing and not depressed appearing.     Impression & Recommendations:  Problem # 1:  HAND PAIN (ICD-729.5)  one trazodone as prescribed but orthopedic surgery for  pain in the wrist and hands.  on further questioning, patient is not sure if it is helping or not.  I told the patient I'll  be available for refills if she feels that  she needs it  but she is thinking about to stop taking it and see how she does  Problem # 2:  CONSTIPATION (ICD-564.00)  ongoing issue, recommend to discuss with GI if needed  The following medications were removed from the medication list:    Lactulose 20 Gm/36ml Soln (Lactulose) .Marland Kitchen... 20 cc 3 times a day as needed for constipation Her updated medication list for this problem includes:    Miralax Powd (Polyethylene glycol 3350) .Marland Kitchen... Dissolve 17 grams (1 capful) in at least 8 ounces water/juice and drink once daily  Problem # 3:  INSOMNIA-SLEEP DISORDER-UNSPEC (ICD-780.52)  continue with Lunesta.    Problem # 4:  OVERWEIGHT (ICD-278.02)  patient takes phentermine prescribed elsewhere on and off. I told the patient that in my opinion such med is  to be taken for a very short period  of time. advised her to discuss this with her other provider  Complete Medication List: 1)  Lunesta 3 Mg Tabs (Eszopiclone) .Marland Kitchen.. 1 by mouth at bedtime as needed 2)  Trazodone Hcl 50 Mg Tabs (Trazodone hcl) .Marland Kitchen.. 1 by mouth two times a day or three times a day 3)  Meloxicam 15 Mg Tabs (Meloxicam) .... Qd 4)  Miralax Powd (Polyethylene glycol 3350) .... Dissolve 17 grams (1 capful) in at least 8 ounces water/juice and drink once daily 5)  Phentermine Hcl 37.5 Mg Caps (Phentermine hcl) .... Qd  Patient Instructions: 1)  call if refills needed  2)   Please schedule a follow-up appointment in 6 months . Fasting,  physical exam   Orders Added: 1)  Est. Patient Level III [[address]]

## 2010-07-06 NOTE — Op Note (Signed)
NAMECLEMMA, Teresa Gilbert            ACCOUNT NO.:  [id number]   MEDICAL RECORD NO.:  [id number]          PATIENT TYPE:  AMB   LOCATION:  [facility]                         FACILITY:  [facility]   PHYSICIAN:  Sigmund I. 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   OPERATION:  Excision of infected urethral diverticulum.   SURGEON:  Dr. Patsi Sears.   ANESTHESIA:  General LMA.   PREPARATION:  After appropriate preanesthesia, the patient was brought  to the operating room and placed on the operating room in the dorsal  supine position where general LMA anesthesia was introduced.  She was  then replaced in the dorsal lithotomy position where the pubis was  prepped with Betadine solution and draped in the usual fashion.   REVIEW OF HISTORY:  This 35 year old female has a history of a  periurethral mass.  The mass appeared to be a 2 cm mass on the right  side of the urethra within the anterior vaginal wall.  It was nontender  to palpation.  MRI showed that the patient has a 12 mm x 12 mm x 11 mm  right sided urethral diverticulum.  No neck could be discerned from the  MRI.   DESCRIPTION OF PROCEDURE:  Using the metal long horn retractor, the  posterior speculum, the vagina was evaluated.  The large urethral  diverticulum was noted to cross the midline onto the left side.  Eight  mL of Marcaine with epinephrine was injected into the periurethral  space, around the diverticulum.  A 3-cm midline incision was made.  The  subcutaneous tissue dissected.  The urethral diverticulum was  identified, and was noted to be stuck to the urethra.  This required  very careful dissection in order to avoid urethral injury.  I began by  dissecting the left edge of the diverticulum off of  the urethra, and  then the inferior edge, and then the superior edge.  The urethral  diverticulum was noted to develop more proximal ward on the right side,  and this was carefully dissected.  Finally, the neck was amputated, it  was noted to be very tiny.  No urine was noted to be leaking from the  urethra, and no catheter could be identified.  Because of a moderate  amount of bleeding, the Bovie unit was used to stop large bleeding.  However, there was still a large amount of bleeding.  It was noted that  the diverticulum had pus within it, and this had been drained during  dissection, no culture could be obtained.  I elected to place one vial  of FloSeal in the wound, and packed the wound for 3 minutes, with  resolution of bleeding sites.  I then closed the urethra in two layers,  with interrupted 2-0 Vicryl suture, and running 3-0 Vicryl suture in the  vaginal epithelium.  No bleeding was noted at the  end of the  procedure.  I elected not to place packing, because it would  not help this wound and this wound is very distal in the urethra.  The  patient will have a PeriPad placed, however.  She had a B and O  suppository at the beginning of the procedure.  She was awakened and  taken to the recovery room in good condition.      Sigmund I. Patsi Sears, M.D.  Electronically Signed     SIT/MEDQ  D:  07/24/2008  T:  07/24/2008  Job:  [id number]   cc:   Lendon Colonel, MD  Fax: [phone]   Willow Ora, MD  [phone] [address] [place], [place] [address]

## 2010-07-09 NOTE — Assessment & Plan Note (Signed)
[facility]                          [place] OFFICE NOTE   NAME:Gilbert, Teresa BOWLDS                   MRN:          [id number]  DATE:10/19/2005                            DOB:          23-Oct-1975    CHIEF COMPLAINT:  Hand pain.   HISTORY OF PRESENT ILLNESS:  Teresa Gilbert is a 35 year old female who came  to Teresa office for Teresa first time with several-week history of left hand pain  and two-week history of right hand pain.  Teresa pain is located at different  places but mostly Teresa wrist and base of Teresa first thumb.  She also is  experiencing numbness on her fingers.  She is not certain where it is worse,  whether in Teresa first to third finger or Teresa fourth to fifth finger.  She has  taken some Advil without much help.  Some of Teresa numbness is sometimes at  night and wakes her up.   PAST MEDICAL HISTORY:  1. She is G0, P0.  2. She has a strong family history of colon cancer.   FAMILY HISTORY:  1. Father has high cholesterol.  2. Mother has diabetes and high cholesterol.  3. Both grandmothers had colon cancer.  4. No history of breast cancer or heart attacks.   SOCIAL HISTORY:  Does not smoke or drink.  Is not married.   MEDICATIONS:  None.   ALLERGIES:  No known drug allergies.   REVIEW OF SYSTEMS:  Teresa Gilbert denies any fever, rash.  She denies any  puffiness on her hands that she can tell.  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  Consequently, in  Teresa last few months, she has typed and written quite a bit.   PHYSICAL EXAMINATION:  GENERAL:  Teresa Gilbert is alert and oriented, in no  apparent distress.  VITAL SIGNS:  She is 5 feet 4 inches, weighs 204 pounds.  Blood pressure  90/60.  Pulse 58.  NECK:  No thyromegaly.  LUNGS:  Clear to auscultation bilaterally.  CARDIOVASCULAR:  Regular rate and rhythm without murmur.  EXTREMITIES:  Hands are free of any swelling.  All Teresa joints of  Teresa hand  and wrist are normal.  Teresa subcutaneous tissue of Teresa forearm is free of any  nodules.  Radial pulses are normal.  Skin of Teresa hands is normal.  Phelan  test and Tinnel test on Teresa wrist are negative.   ASSESSMENT/PLAN:  1. Teresa Gilbert presents today with hand pain and paresthesias.  This is      most likely overuse syndrome from typing and writing a lot and, also,      Teresa paresthesias may represent an atypical carpal tunnel syndrome      presentation.  At this point, I advised her to use bilateral carpal      tunnel syndrome splints day and night for Teresa first few days and then      only at night.  If she is not better, she is to let me know.  We will  screen her for thyroid disease with a TSH and for diabetes with a blood      sugar test.  2. She has a strong family history of colon cancer and I encouraged her to      discuss that on her next complete physical.  She states that she does      not have a primary care doctor at this point but she likes to come back      here for that purpose.                                   Willow Ora, MD   JP/MedQ  DD:  10/19/2005  DT:  10/20/2005  Job #:  [id number]

## 2010-08-14 ENCOUNTER — Other Ambulatory Visit: Payer: Self-pay | Admitting: Internal Medicine

## 2010-08-16 NOTE — Telephone Encounter (Signed)
60, no RF 

## 2010-09-02 ENCOUNTER — Ambulatory Visit (INDEPENDENT_AMBULATORY_CARE_PROVIDER_SITE_OTHER): Payer: BC Managed Care – PPO | Admitting: Internal Medicine

## 2010-09-02 ENCOUNTER — Encounter: Payer: Self-pay | Admitting: Internal Medicine

## 2010-09-02 DIAGNOSIS — K5901 Slow transit constipation: Secondary | ICD-10-CM

## 2010-09-02 DIAGNOSIS — K5900 Constipation, unspecified: Secondary | ICD-10-CM

## 2010-09-02 MED ORDER — POLYETHYLENE GLYCOL 3350 17 GM/SCOOP PO POWD: ORAL | Status: DC

## 2010-09-02 MED ORDER — DOCUSATE SODIUM 100 MG PO CAPS: ORAL_CAPSULE | ORAL | Status: DC

## 2010-09-02 NOTE — Patient Instructions (Addendum)
We have given you a sitz marker. You will need to have an x-ray of your abdomen (KUB) on 09/07/10 BEFORE 9:15 am. You will go to our basement floor to radiology for that. Please make sure that you do not use ANY laxatives or stool softeners (ex. Miralax, dulcolax) until AFTER your test. We have sent refills to your pharmacy for Miralax. Please purchase dulcolax 100 mg over the counter. You should take 1 capsule by mouth three times per WEEK. CC:Dr Drue Novel

## 2010-09-02 NOTE — Progress Notes (Signed)
Teresa Gilbert Aug 02, 1975 MRN [id number]     History of Present Illness:  This is a 35 year old African American female with chronic constipation. She dates the onset of constipation to when she suffered a back injury in 2008.  She has been on MiraLax 17 g daily but complains of inability to push the stool out. She has never had any pelvic surgery or childbirth. A colonoscopy in September 2011 was normal except for hyperplastic polyps. She has a positive family history of colon cancer in 2 grandparents. She failed lactulose. She takes glycerin suppositories or enemas. 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 Her back injury was evaluated by a neurosurgeon. She was treated with physical therapy, but no surgery.   Past Medical History  Diagnosis Date  . Anxiety and depression   . Insomnia   . GERD (gastroesophageal reflux disease)   . Obesity   . Hyperplastic colon polyp 12/04/09   Past Surgical History  Procedure Date  . Hip arthroplasty     right  . Excision of urethral diverticulum     reports that she has never smoked. She has never used smokeless tobacco. She reports that she does not drink alcohol or use illicit drugs. family history includes Colon cancer in her maternal grandmother and paternal grandmother; Diabetes in her cousin, maternal aunt, mother, and unspecified family member; and Lung cancer in an unspecified family member. No Known Allergies      Review of Systems:  The remainder of the 10  point ROS is negative except as outlined in H&P   Physical Exam: General appearance  Well developed in no distress. Eyes- non icteric. HEENT nontraumatic, normocephalic. Mouth no lesions, tongue papillated, no cheilosis. Neck supple without adenopathy, thyroid not enlarged, no carotid bruits, no JVD. Lungs Clear to auscultation bilaterally. Cor normal S1 normal S2, regular rhythm , no murmur,  quiet precordium. Abdomen soft, mildly obese abdomen with normal active bowel  sounds. Minimal tenderness right lower quadrant. No palpable mass or stool. Rectal: Not done. Extremities no pedal edema. Skin no lesions. Neurological alert and oriented x 3. Psychological normal mood and affect.  Assessment and Plan:  Problem #1 Functional constipation suggestive of pelvic floor dysfunction. We will obtain a Sitzmarks study then restart her on MiraLax 17 g daily in addition to dulcolax tablets 3 times a week. Pending the results of the Sitzmarks study, she may need to be referred to the pelvic floor dysfunction clinic.   09/02/2010 Lina Sar

## 2010-09-07 ENCOUNTER — Ambulatory Visit (INDEPENDENT_AMBULATORY_CARE_PROVIDER_SITE_OTHER)
Admission: RE | Admit: 2010-09-07 | Discharge: 2010-09-07 | Disposition: A | Discharge status: Home / Self Care | Payer: BC Managed Care – PPO | Source: Ambulatory Visit | Attending: Internal Medicine | Admitting: Internal Medicine

## 2010-09-07 DIAGNOSIS — K5900 Constipation, unspecified: Secondary | ICD-10-CM

## 2010-09-08 ENCOUNTER — Encounter: Payer: Self-pay | Admitting: Gastroenterology

## 2010-09-08 ENCOUNTER — Telehealth: Payer: Self-pay | Admitting: Internal Medicine

## 2010-09-08 ENCOUNTER — Telehealth: Payer: Self-pay | Admitting: *Deleted

## 2010-09-08 NOTE — Telephone Encounter (Signed)
Message copied by Teresa Gilbert on Wed Sep 08, 2010 10:41 AM ------      Message from: Hart Carwin      Created: Tue Sep 07, 2010 10:32 PM       Please call pt with results of Sitz marks. No Sitz marks visible on x-ray. She was able to have sufficient bowl movements to clear the Sitz marks

## 2010-09-08 NOTE — Telephone Encounter (Signed)
Left a message for patient. 

## 2010-09-08 NOTE — Telephone Encounter (Signed)
Patient given the results as per Dr. Juanda Chance

## 2010-09-13 MED ORDER — HYDROCORTISONE ACE-PRAMOXINE 2.5-1 % RE CREA: TOPICAL_CREAM | RECTAL | Status: DC

## 2010-09-13 NOTE — Telephone Encounter (Signed)
Called patient back and she is reporting pain with bowel movements and some blood on the tissue when she wipes. She is also asking about a referral to the pelvis floor dysfunction clinic. Please, advise.

## 2010-09-13 NOTE — Telephone Encounter (Signed)
Scheduled patient at [facility] with PT on 09/16/10 with arrival at 11:45 AM for 12:00 PM. Rx sent to pharmacy. Left patient a message to call me back.

## 2010-09-13 NOTE — Telephone Encounter (Signed)
Spoke with patient and gave her the appointment date and time for PT. She will pick up her rx.

## 2010-09-13 NOTE — Telephone Encounter (Signed)
Please send Analpram cream 2.5 % 15gm , apply tid to rectum prn irritation, also , please ask Dottie for the tel # to Urology/pelvic floor dysfunction clinic

## 2010-09-22 HISTORY — PX: BUNIONECTOMY: SHX129

## 2010-10-01 ENCOUNTER — Telehealth: Payer: Self-pay | Admitting: Internal Medicine

## 2010-10-05 NOTE — Telephone Encounter (Signed)
Error

## 2010-11-04 ENCOUNTER — Encounter: Payer: BC Managed Care – PPO | Admitting: Internal Medicine

## 2010-12-14 ENCOUNTER — Encounter: Payer: Self-pay | Admitting: Internal Medicine

## 2010-12-15 ENCOUNTER — Ambulatory Visit (INDEPENDENT_AMBULATORY_CARE_PROVIDER_SITE_OTHER): Payer: BC Managed Care – PPO | Admitting: Internal Medicine

## 2010-12-15 ENCOUNTER — Encounter: Payer: Self-pay | Admitting: Internal Medicine

## 2010-12-15 DIAGNOSIS — M2550 Pain in unspecified joint: Secondary | ICD-10-CM

## 2010-12-15 DIAGNOSIS — Z0000 Encounter for general adult medical examination without abnormal findings: Secondary | ICD-10-CM

## 2010-12-15 LAB — CBC WITH DIFFERENTIAL/PLATELET
Basophils Relative: 0.7 % (ref 0.0–3.0)
Eosinophils Relative: 0.7 % (ref 0.0–5.0)
HCT: 35.9 % — ABNORMAL LOW (ref 36.0–46.0)
Lymphs Abs: 2.2 10*3/uL (ref 0.7–4.0)
MCHC: 34.1 g/dL (ref 30.0–36.0)
MCV: 92 fl (ref 78.0–100.0)
Monocytes Absolute: 0.4 10*3/uL (ref 0.1–1.0)
Neutro Abs: 3.2 10*3/uL (ref 1.4–7.7)
RBC: 3.9 Mil/uL (ref 3.87–5.11)
WBC: 5.9 10*3/uL (ref 4.5–10.5)

## 2010-12-15 LAB — LIPID PANEL
Cholesterol: 173 mg/dL (ref 0–200)
Triglycerides: 69 mg/dL (ref 0.0–149.0)

## 2010-12-15 LAB — COMPREHENSIVE METABOLIC PANEL
Albumin: 4 g/dL (ref 3.5–5.2)
BUN: 8 mg/dL (ref 6–23)
CO2: 25 mEq/L (ref 19–32)
GFR: 104.85 mL/min (ref >60.00)
Glucose, Bld: 87 mg/dL (ref 70–99)
Potassium: 3.5 mEq/L (ref 3.5–5.1)
Sodium: 139 mEq/L (ref 135–145)
Total Bilirubin: 0.9 mg/dL (ref 0.3–1.2)
Total Protein: 7.4 g/dL (ref 6.0–8.3)

## 2010-12-15 NOTE — Progress Notes (Signed)
  Subjective:    Patient ID: Teresa Gilbert, female    DOB: 1975/06/02, 35 y.o.   MRN: [id number]  HPI Complete physical exam   Past Medical History: GO, no BC Anxiety, Depression insomnia GERD Back  and hip pain since 10-2006 , WC related injury, s/p local injection 02-2008 and 04-2008 wrist pain since 2008, states was injured at work, Rx trazodone  Urinary Tract Infection Obesity  Past Surgical History: urethral diverticulum (dr Patsi Sears 07/24/2008) R  Hip arthroscopy 02-2009  R foot surgery  09-2010 (bunion, toes ) per podiatry  Family History: CAD - no HTN - M. Aunts stroke - no DM - M, M. 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 Occasional nausea, no vomiting. Very mild abdominal pain from time to time. Still sees some red blood per rectum sometimes, small amount. Complaints of fatigue which is an ongoing problem, denies feeling sleepy throughout the day, no snoring on a regular basis that she knows of. Depression and anxiety relatively well controlled, she sees a psychiatrist on a regular basis.     Objective:   Physical Exam  Constitutional: She is oriented to person, place, and time. She appears well-developed. No distress.  Neck: No thyromegaly present.       Normal carotid pulses  Cardiovascular: Normal rate, regular rhythm and normal heart sounds.   No murmur heard. Pulmonary/Chest: Effort normal and breath sounds normal. No respiratory distress. She has no wheezes. She has no rales.  Abdominal: Bowel sounds are normal. She exhibits no  distension. There is no tenderness. There is no rebound.  Musculoskeletal: She exhibits no edema.       Has a boot at the right leg, had surgery in the right foot 2 months ago  Neurological: She is alert and oriented to person, place, and time.  Skin: She is not diaphoretic.  Psychiatric: She has a normal mood and affect. Her behavior is normal. Judgment and thought content normal.          Assessment & Plan:

## 2010-12-15 NOTE — Assessment & Plan Note (Addendum)
Td 2008  flu shot -- declined female care per gyn Labs Cscope 11-2009, next 10 years, DR Westley Gambles Fatigue -- labs, denies snoring  BP rechecked by my: 105/70

## 2010-12-15 NOTE — Assessment & Plan Note (Signed)
Back  and hip pain since 10-2006 , WC related injury, s/p local injection 02-2008 and 04-2008 wrist pain since 2008, states was injured at work, Rx trazodone

## 2010-12-15 NOTE — Patient Instructions (Signed)
Take miralax daily

## 2011-09-15 ENCOUNTER — Ambulatory Visit (INDEPENDENT_AMBULATORY_CARE_PROVIDER_SITE_OTHER): Payer: BC Managed Care – PPO | Admitting: Internal Medicine

## 2011-09-15 VITALS — BP 126/74 | HR 77 | Temp 98.3°F | Wt 198.0 lb

## 2011-09-15 DIAGNOSIS — E611 Iron deficiency: Secondary | ICD-10-CM

## 2011-09-15 DIAGNOSIS — G4700 Insomnia, unspecified: Secondary | ICD-10-CM

## 2011-09-15 DIAGNOSIS — D509 Iron deficiency anemia, unspecified: Secondary | ICD-10-CM

## 2011-09-15 DIAGNOSIS — F419 Anxiety disorder, unspecified: Secondary | ICD-10-CM

## 2011-09-15 DIAGNOSIS — R238 Other skin changes: Secondary | ICD-10-CM

## 2011-09-15 DIAGNOSIS — K5900 Constipation, unspecified: Secondary | ICD-10-CM

## 2011-09-15 DIAGNOSIS — F32A Depression, unspecified: Secondary | ICD-10-CM

## 2011-09-15 DIAGNOSIS — R21 Rash and other nonspecific skin eruption: Secondary | ICD-10-CM

## 2011-09-15 LAB — CBC WITH DIFFERENTIAL/PLATELET
Basophils Absolute: 0.1 10*3/uL (ref 0.0–0.1)
Eosinophils Absolute: 0.1 10*3/uL (ref 0.0–0.7)
HCT: 37.5 % (ref 36.0–46.0)
Hemoglobin: 12.6 g/dL (ref 12.0–15.0)
Lymphs Abs: 2 10*3/uL (ref 0.7–4.0)
MCHC: 33.7 g/dL (ref 30.0–36.0)
MCV: 91.6 fl (ref 78.0–100.0)
Monocytes Absolute: 0.3 10*3/uL (ref 0.1–1.0)
Monocytes Relative: 5.9 % (ref 3.0–12.0)
Neutro Abs: 3.2 10*3/uL (ref 1.4–7.7)
RDW: 13.8 % (ref 11.5–14.6)

## 2011-09-15 LAB — PROTIME-INR: Prothrombin Time: 11.4 s (ref 10.2–12.4)

## 2011-09-15 NOTE — Progress Notes (Signed)
  Subjective:    Patient ID: Teresa Gilbert, female    DOB: 12/21/1975, 36 y.o.   MRN: [id number]  HPI Here to discuss the following issues: In June and July she had several red spots around the neck and anterior chest, they were slightly itchy and resolve on its own. After that, she has been left with "scars" (which are dark spots).  Also she saw dermatology before these issues due to hair loss, she was told she had iron deficiency. She is also quite concerned about a bruise in her right arm, the area is puffy and sore. No injury. Similar problem in her back.  History of constipation, this is ongoing, saw GI @ [place] before, on miralax w/o improvement, likes a 2nd opinion  Past Medical History:  GO, no BC  Anxiety, Depression  insomnia  GERD  Back and hip pain since 10-2006 , WC related injury, s/p local injection 02-2008 and 04-2008  wrist pain since 2008, states was injured at work, Rx trazodone  H/o UTI Obesity  Past Surgical History: urethral diverticulum (dr Patsi Sears 07/24/2008) R  Hip arthroscopy 02-2009   R foot surgery  09-2010 (bunion, toes ) per podiatry  Family History: CAD - no HTN - M. Aunts stroke - no DM - M, M. 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 She admits to occasional gum bleeding and nosebleeds. Periods are regular, monthly, last 3 days, only the first day is heavy. Medication list is updated, she has discontinued taking Cymbalta and sleeping medication.    Objective:   Physical Exam  General -- alert, well-developed, and overweight appearing. No apparent distress.  Skin-- Few macular hyperpigmentation areas around the neck and anterior chest. Not scaly, no red. Extremities-- no pretibial edema bilaterally  . A round to ecchymoses at the right forearm, around 2 cm in diameter. Psych-- Cognition and judgment appear intact. Alert and cooperative with normal attention span and concentration.  not anxious appearing and not depressed appearing.      Assessment & Plan:

## 2011-09-15 NOTE — Assessment & Plan Note (Signed)
Currently on no medications, this  was managed by another provider 

## 2011-09-15 NOTE — Assessment & Plan Note (Signed)
Currently on no medications, this  was managed by another provider

## 2011-09-15 NOTE — Assessment & Plan Note (Signed)
Reports a history of a rash in the neck and chest, now with what seems to be post inflammatory hyperpigmentation Recommend observation, she also could use sun blockers to accelerate her recovery.

## 2011-09-15 NOTE — Assessment & Plan Note (Addendum)
Ongoing issue, likes another opinion from a different gastroenterologist. Plan:  Refer to GI pending results of iron.  If she has iron deficiency, the referral will include a request for eval  for iron deficiency anemia ( given the fact that her periods are regular and not heavy)

## 2011-09-15 NOTE — Assessment & Plan Note (Signed)
She is quite concerned about easy bruising, review of systems shows some gum bleeding. Also was told she had iron deficiency. Plan: CBC, PT, PTT, iron.

## 2011-09-16 ENCOUNTER — Encounter: Payer: Self-pay | Admitting: *Deleted

## 2011-11-15 DIAGNOSIS — G8929 Other chronic pain: Secondary | ICD-10-CM | POA: Insufficient documentation

## 2012-04-07 ENCOUNTER — Other Ambulatory Visit: Payer: Self-pay

## 2012-08-10 ENCOUNTER — Encounter: Payer: Self-pay | Admitting: Internal Medicine

## 2012-08-10 ENCOUNTER — Ambulatory Visit (INDEPENDENT_AMBULATORY_CARE_PROVIDER_SITE_OTHER): Payer: BC Managed Care – PPO | Admitting: Internal Medicine

## 2012-08-10 VITALS — BP 132/98 | HR 82 | Temp 98.4°F | Wt 211.8 lb

## 2012-08-10 DIAGNOSIS — E079 Disorder of thyroid, unspecified: Secondary | ICD-10-CM | POA: Insufficient documentation

## 2012-08-10 DIAGNOSIS — R238 Other skin changes: Secondary | ICD-10-CM

## 2012-08-10 DIAGNOSIS — F341 Dysthymic disorder: Secondary | ICD-10-CM

## 2012-08-10 DIAGNOSIS — F329 Major depressive disorder, single episode, unspecified: Secondary | ICD-10-CM

## 2012-08-10 DIAGNOSIS — K5900 Constipation, unspecified: Secondary | ICD-10-CM

## 2012-08-10 DIAGNOSIS — F32A Depression, unspecified: Secondary | ICD-10-CM

## 2012-08-10 DIAGNOSIS — Z01818 Encounter for other preprocedural examination: Secondary | ICD-10-CM | POA: Insufficient documentation

## 2012-08-10 LAB — TSH: TSH: 0.33 u[IU]/mL — ABNORMAL LOW (ref 0.35–5.50)

## 2012-08-10 LAB — BASIC METABOLIC PANEL
Calcium: 9.6 mg/dL (ref 8.4–10.5)
GFR: 96.84 mL/min (ref >60.00)
Glucose, Bld: 97 mg/dL (ref 70–99)
Sodium: 138 mEq/L (ref 135–145)

## 2012-08-10 NOTE — Assessment & Plan Note (Signed)
Ongoing problem with occasional blood per rectum after a BM, last colonoscopy 2011

## 2012-08-10 NOTE — Assessment & Plan Note (Signed)
Not an issue at this point 

## 2012-08-10 NOTE — Progress Notes (Signed)
  Subjective:    Patient ID: Teresa Gilbert, female    DOB: 08/03/1975, 37 y.o.   MRN: [id number]  HPI  Here for a preop eval. Patient has a history of fibroids, reports her gynecologist plans to do a hysterectomy without oophorectomy due to pain. She also has a history of chronic constipation, has seen GI before. She is taking a thyroid supplement as prescribed by another practitioner   Past Medical History  Diagnosis Date  . Insomnia   . GERD (gastroesophageal reflux disease)   . Obesity   . Hyperplastic colon polyp 12/04/09  . Hip pain     chronic pain , WC  . Back pain     WC  . Wrist pain 2008    WC  . Anxiety and depression 06/22/2007   Past Surgical History  Procedure Laterality Date  . Hip arthroscopy  2011    right  . Excision of urethral diverticulum  2010    Dr Patsi Sears  . Bunionectomy Right 09-2010     bunion, toes -- per podiatry   History   Social History  . Marital Status: Single    Spouse Name: N/A    Number of Children: 0  . Years of Education: N/A   Occupational History  . special ed teacher    Social History Main Topics  . Smoking status: Never Smoker   . Smokeless tobacco: Never Used  . Alcohol Use: Yes     Comment: rare  . Drug Use: No  . Sexually Active: Not on file   Other Topics Concern  . Not on file   Social History Narrative   Lives by herself   Family History  Problem Relation Age of Onset  . Diabetes Mother   . Diabetes Maternal Aunt   . Diabetes      uncle  . Diabetes Cousin   . Colon cancer Paternal Grandmother   . Cancer Paternal Grandmother     colon  . Colon cancer Maternal Grandmother   . Lung cancer      grandfather  . Stroke Paternal Grandfather     dementia     Review of Systems Denies chest pain or shortness or breath. No orthopnea or lower extremity edema. Able to do all her ADL and go up 3 flair of stairs without difficulty breathing. No cough or wheezing. No nausea, vomiting. Occasionally sees  blood when she wipes after a BM, last colonoscopy 2011. When asked, she admits to snoring, denies feeling sleepy and falling asleep inappropriately. She has some fatigue, she thinks related to gaining weight after a Lupron injection. Denies anxiety or depression. Denies easy bruising; never had post surgical bleeding     Objective:   Physical Exam BP 132/98  Pulse 82  Temp(Src) 98.4 F (36.9 C) (Oral)  Wt 211 lb 12.8 oz (96.072 kg)  BMI 34.2 kg/m2  SpO2 98% General -- alert, well-developed, NAD   Neck --no JVD at 45  Lungs -- normal respiratory effort, no intercostal retractions, no accessory muscle use, and normal breath sounds.   Heart-- normal rate, regular rhythm, no murmur, and no gallop.   Abdomen--soft, non-tender, no distention    Extremities-- no pretibial edema bilaterally Neurologic-- alert & oriented X3 and strength normal in all extremities. Psych-- Cognition and judgment appear intact. Alert and cooperative with normal attention span and concentration.  not anxious appearing and not depressed appearing.       Assessment & Plan:

## 2012-08-10 NOTE — Assessment & Plan Note (Addendum)
37 year old lady needs a hysterectomy, review of systems and exam negative for cardiopulmonary problems. EKG today NSR Reports   recently had a CBC a gynecology consequently will not repeat. Check a BMP.  If labs normal, she is clear for surgery and will let Dr Ernestina Penna know

## 2012-08-10 NOTE — Assessment & Plan Note (Addendum)
No easy bruising at this point, PTT last year was slt above normal, never had post surgical complications thus  will let anesthesia decide if PT PTT needs to be recheck

## 2012-08-10 NOTE — Assessment & Plan Note (Addendum)
Reports she is taking "thyroid supplements" prescribed by a integrative medicine MD labs

## 2012-08-11 ENCOUNTER — Encounter: Payer: Self-pay | Admitting: Internal Medicine

## 2012-08-14 ENCOUNTER — Telehealth: Payer: Self-pay | Admitting: Internal Medicine

## 2012-08-14 ENCOUNTER — Other Ambulatory Visit (INDEPENDENT_AMBULATORY_CARE_PROVIDER_SITE_OTHER): Payer: BC Managed Care – PPO

## 2012-08-14 ENCOUNTER — Telehealth: Payer: Self-pay | Admitting: *Deleted

## 2012-08-14 DIAGNOSIS — R6889 Other general symptoms and signs: Secondary | ICD-10-CM

## 2012-08-14 DIAGNOSIS — R7989 Other specified abnormal findings of blood chemistry: Secondary | ICD-10-CM

## 2012-08-14 LAB — T4, FREE: Free T4: 0.66 ng/dL (ref 0.60–1.60)

## 2012-08-14 NOTE — Telephone Encounter (Signed)
Spoke with pt, she came in today to have her labs re-checked.

## 2012-08-14 NOTE — Telephone Encounter (Signed)
Left detailed msg on pt's vmail to call the office, schedule a lab appt to have her thyroid re-checked.

## 2012-08-14 NOTE — Telephone Encounter (Signed)
Patient is returning a missed call. She thinks it is related to coming in for blood work.

## 2012-08-14 NOTE — Telephone Encounter (Signed)
Lab orders entered

## 2012-08-14 NOTE — Telephone Encounter (Signed)
Message copied by Nada Maclachlan on Tue Aug 14, 2012 10:24 AM ------      Message from: Silvio Pate D      Created: Mon Aug 13, 2012 10:47 AM       Dr. Drue Novel             Pt will have to come back in for blood work is to old , I  cannot add the T3 or T4 .Marland Kitchen      ----- Message -----         From: Wanda Plump, MD         Sent: 08/12/2012   4:02 PM           To: Vance Gather            Please at the free T3, T4 DX abnormal TSH       ------

## 2012-08-15 ENCOUNTER — Encounter: Payer: Self-pay | Admitting: *Deleted

## 2012-08-16 ENCOUNTER — Other Ambulatory Visit: Payer: Self-pay | Admitting: Obstetrics

## 2012-08-21 ENCOUNTER — Encounter (HOSPITAL_COMMUNITY): Payer: Self-pay

## 2012-08-27 ENCOUNTER — Encounter (HOSPITAL_COMMUNITY): Payer: Self-pay

## 2012-08-27 ENCOUNTER — Encounter (HOSPITAL_COMMUNITY)
Admission: RE | Admit: 2012-08-27 | Discharge: 2012-08-27 | Disposition: A | Discharge status: Home / Self Care | Payer: BC Managed Care – PPO | Source: Ambulatory Visit | Attending: Obstetrics | Admitting: Obstetrics

## 2012-08-27 DIAGNOSIS — Z01812 Encounter for preprocedural laboratory examination: Secondary | ICD-10-CM | POA: Insufficient documentation

## 2012-08-27 DIAGNOSIS — Z01818 Encounter for other preprocedural examination: Secondary | ICD-10-CM | POA: Insufficient documentation

## 2012-08-27 LAB — CBC
MCH: 29.9 pg (ref 26.0–34.0)
MCV: 88.4 fL (ref 78.0–100.0)
Platelets: 330 10*3/uL (ref 150–400)
RDW: 13.1 % (ref 11.5–15.5)

## 2012-08-27 LAB — BASIC METABOLIC PANEL
CO2: 26 mEq/L (ref 19–32)
Calcium: 9.9 mg/dL (ref 8.4–10.5)
Creatinine, Ser: 0.8 mg/dL (ref 0.50–1.10)
Glucose, Bld: 94 mg/dL (ref 70–99)

## 2012-08-27 NOTE — Patient Instructions (Signed)
Your procedure is scheduled on:09/03/12  Enter through the Main Entrance at :1130am Pick up desk phone and dial [phone] and inform us of your arrival.  Please call [phone] if you have any problems the morning of surgery.  Remember: Do not eat food after midnight:Sunday Clear liquids are ok until:9am on Monday   You may brush your teeth the morning of surgery.  Take these meds the morning of surgery with a sip of water:thyroid med  DO NOT wear jewelry, eye make-up, lipstick,body lotion, or dark fingernail polish.  (Polished toes are ok) You may wear deodorant.  If you are to be admitted after surgery, leave suitcase in car until your room has been assigned. Patients discharged on the day of surgery will not be allowed to drive home. Wear loose fitting, comfortable clothes for your ride home.

## 2012-09-03 ENCOUNTER — Encounter (HOSPITAL_COMMUNITY)
Admission: RE | Disposition: A | Discharge status: Home / Self Care | Payer: Self-pay | Source: Ambulatory Visit | Attending: Obstetrics

## 2012-09-03 ENCOUNTER — Encounter (HOSPITAL_COMMUNITY): Payer: Self-pay | Admitting: Anesthesiology

## 2012-09-03 ENCOUNTER — Ambulatory Visit (HOSPITAL_COMMUNITY): Payer: BC Managed Care – PPO | Admitting: Anesthesiology

## 2012-09-03 ENCOUNTER — Ambulatory Visit (HOSPITAL_COMMUNITY)
Admission: RE | Admit: 2012-09-03 | Discharge: 2012-09-04 | Disposition: A | Discharge status: Home / Self Care | Payer: BC Managed Care – PPO | Source: Ambulatory Visit | Attending: Obstetrics | Admitting: Obstetrics

## 2012-09-03 DIAGNOSIS — D500 Iron deficiency anemia secondary to blood loss (chronic): Secondary | ICD-10-CM | POA: Insufficient documentation

## 2012-09-03 DIAGNOSIS — N72 Inflammatory disease of cervix uteri: Secondary | ICD-10-CM | POA: Insufficient documentation

## 2012-09-03 DIAGNOSIS — D251 Intramural leiomyoma of uterus: Secondary | ICD-10-CM | POA: Insufficient documentation

## 2012-09-03 DIAGNOSIS — D252 Subserosal leiomyoma of uterus: Secondary | ICD-10-CM | POA: Insufficient documentation

## 2012-09-03 DIAGNOSIS — N920 Excessive and frequent menstruation with regular cycle: Secondary | ICD-10-CM | POA: Insufficient documentation

## 2012-09-03 HISTORY — PX: ROBOTIC ASSISTED TOTAL HYSTERECTOMY: SHX6085

## 2012-09-03 HISTORY — PX: LAPAROSCOPIC BILATERAL SALPINGECTOMY: SHX5889

## 2012-09-03 LAB — TYPE AND SCREEN
ABO/RH(D): O POS
Antibody Screen: NEGATIVE

## 2012-09-03 SURGERY — ROBOTIC ASSISTED TOTAL HYSTERECTOMY
Anesthesia: General | Site: Abdomen | Wound class: Clean Contaminated

## 2012-09-03 MED ORDER — ROCURONIUM BROMIDE 100 MG/10ML IV SOLN
INTRAVENOUS | Status: DC | PRN
  Administered 2012-09-03: 45 mg via INTRAVENOUS
  Administered 2012-09-03: 5 mg via INTRAVENOUS
  Administered 2012-09-03: 10 mg via INTRAVENOUS

## 2012-09-03 MED ORDER — SODIUM CHLORIDE 0.9 % IV SOLN: INTRAVENOUS | Status: DC

## 2012-09-03 MED ORDER — ROCURONIUM BROMIDE 50 MG/5ML IV SOLN
INTRAVENOUS | Status: AC
  Filled 2012-09-03: qty 1

## 2012-09-03 MED ORDER — FENTANYL CITRATE 0.05 MG/ML IJ SOLN
INTRAMUSCULAR | Status: AC
  Filled 2012-09-03: qty 5

## 2012-09-03 MED ORDER — ZOLPIDEM TARTRATE 5 MG PO TABS
5.0000 mg | ORAL_TABLET | Freq: Every evening | ORAL | Status: DC | PRN
  Administered 2012-09-03: 5 mg via ORAL
  Filled 2012-09-03: qty 1

## 2012-09-03 MED ORDER — PROPOFOL 10 MG/ML IV BOLUS
INTRAVENOUS | Status: DC | PRN
  Administered 2012-09-03: 200 mg via INTRAVENOUS

## 2012-09-03 MED ORDER — NEOSTIGMINE METHYLSULFATE 1 MG/ML IJ SOLN
INTRAMUSCULAR | Status: AC
  Filled 2012-09-03: qty 1

## 2012-09-03 MED ORDER — LACTATED RINGERS IR SOLN
Status: DC | PRN
  Administered 2012-09-03: 3000 mL

## 2012-09-03 MED ORDER — ONDANSETRON HCL 4 MG/2ML IJ SOLN
4.0000 mg | Freq: Four times a day (QID) | INTRAMUSCULAR | Status: DC | PRN
  Administered 2012-09-04: 4 mg via INTRAVENOUS
  Filled 2012-09-03: qty 2

## 2012-09-03 MED ORDER — LABETALOL HCL 5 MG/ML IV SOLN
INTRAVENOUS | Status: DC | PRN
  Administered 2012-09-03: 5 mg via INTRAVENOUS

## 2012-09-03 MED ORDER — BUPIVACAINE HCL (PF) 0.25 % IJ SOLN
INTRAMUSCULAR | Status: DC | PRN
  Administered 2012-09-03: 16 mL

## 2012-09-03 MED ORDER — KETOROLAC TROMETHAMINE 30 MG/ML IJ SOLN: 30.0000 mg | Freq: Four times a day (QID) | INTRAMUSCULAR | Status: DC

## 2012-09-03 MED ORDER — IBUPROFEN 600 MG PO TABS: 600.0000 mg | ORAL_TABLET | Freq: Four times a day (QID) | ORAL | Status: DC | PRN

## 2012-09-03 MED ORDER — PANTOPRAZOLE SODIUM 40 MG PO TBEC
40.0000 mg | DELAYED_RELEASE_TABLET | Freq: Every day | ORAL | Status: DC
  Administered 2012-09-03 – 2012-09-04 (×2): 40 mg via ORAL
  Filled 2012-09-03 (×3): qty 1

## 2012-09-03 MED ORDER — HYDROMORPHONE HCL PF 1 MG/ML IJ SOLN: 0.2500 mg | INTRAMUSCULAR | Status: DC | PRN

## 2012-09-03 MED ORDER — CEFAZOLIN SODIUM-DEXTROSE 2-3 GM-% IV SOLR: 2.0000 g | INTRAVENOUS | Status: DC

## 2012-09-03 MED ORDER — IBUPROFEN 600 MG PO TABS
600.0000 mg | ORAL_TABLET | Freq: Four times a day (QID) | ORAL | Status: DC | PRN
  Administered 2012-09-04: 600 mg via ORAL
  Filled 2012-09-03: qty 1

## 2012-09-03 MED ORDER — MIDAZOLAM HCL 2 MG/2ML IJ SOLN
INTRAMUSCULAR | Status: AC
  Filled 2012-09-03: qty 2

## 2012-09-03 MED ORDER — NEOSTIGMINE METHYLSULFATE 1 MG/ML IJ SOLN
INTRAMUSCULAR | Status: DC | PRN
  Administered 2012-09-03: 2 mg via INTRAVENOUS
  Administered 2012-09-03: 1 mg via INTRAVENOUS

## 2012-09-03 MED ORDER — LIDOCAINE HCL (CARDIAC) 20 MG/ML IV SOLN
INTRAVENOUS | Status: AC
  Filled 2012-09-03: qty 5

## 2012-09-03 MED ORDER — LABETALOL HCL 5 MG/ML IV SOLN
INTRAVENOUS | Status: AC
  Filled 2012-09-03: qty 4

## 2012-09-03 MED ORDER — LIDOCAINE HCL (CARDIAC) 20 MG/ML IV SOLN
INTRAVENOUS | Status: DC | PRN
  Administered 2012-09-03: 80 mg via INTRAVENOUS

## 2012-09-03 MED ORDER — BUPIVACAINE HCL (PF) 0.25 % IJ SOLN
INTRAMUSCULAR | Status: AC
  Filled 2012-09-03: qty 30

## 2012-09-03 MED ORDER — CEFAZOLIN SODIUM-DEXTROSE 2-3 GM-% IV SOLR
INTRAVENOUS | Status: AC
  Administered 2012-09-03: 2 g via INTRAVENOUS
  Filled 2012-09-03: qty 50

## 2012-09-03 MED ORDER — FENTANYL CITRATE 0.05 MG/ML IJ SOLN
INTRAMUSCULAR | Status: DC | PRN
  Administered 2012-09-03 (×2): 100 ug via INTRAVENOUS
  Administered 2012-09-03: 50 ug via INTRAVENOUS
  Administered 2012-09-03: 100 ug via INTRAVENOUS
  Administered 2012-09-03 (×3): 50 ug via INTRAVENOUS

## 2012-09-03 MED ORDER — OXYCODONE-ACETAMINOPHEN 5-325 MG PO TABS
1.0000 | ORAL_TABLET | ORAL | Status: DC | PRN
  Administered 2012-09-04 (×2): 1 via ORAL
  Filled 2012-09-03 (×3): qty 1

## 2012-09-03 MED ORDER — ONDANSETRON HCL 4 MG PO TABS
4.0000 mg | ORAL_TABLET | Freq: Four times a day (QID) | ORAL | Status: DC | PRN
  Administered 2012-09-04: 4 mg via ORAL
  Filled 2012-09-03: qty 1

## 2012-09-03 MED ORDER — OXYCODONE-ACETAMINOPHEN 5-325 MG PO TABS: 1.0000 | ORAL_TABLET | ORAL | Status: DC | PRN

## 2012-09-03 MED ORDER — HYDROMORPHONE HCL PF 1 MG/ML IJ SOLN
1.0000 mg | INTRAMUSCULAR | Status: DC | PRN
Start: 2012-09-03 — End: 2012-09-05

## 2012-09-03 MED ORDER — MEPERIDINE HCL 25 MG/ML IJ SOLN: 6.2500 mg | INTRAMUSCULAR | Status: DC | PRN

## 2012-09-03 MED ORDER — ARTIFICIAL TEARS OP OINT
TOPICAL_OINTMENT | OPHTHALMIC | Status: AC
  Filled 2012-09-03: qty 3.5

## 2012-09-03 MED ORDER — ONDANSETRON HCL 4 MG/2ML IJ SOLN
INTRAMUSCULAR | Status: DC | PRN
  Administered 2012-09-03: 4 mg via INTRAVENOUS

## 2012-09-03 MED ORDER — MIDAZOLAM HCL 2 MG/2ML IJ SOLN: 0.5000 mg | Freq: Once | INTRAMUSCULAR | Status: DC | PRN

## 2012-09-03 MED ORDER — KETOROLAC TROMETHAMINE 30 MG/ML IJ SOLN
INTRAMUSCULAR | Status: AC
  Filled 2012-09-03: qty 1

## 2012-09-03 MED ORDER — GLYCOPYRROLATE 0.2 MG/ML IJ SOLN
INTRAMUSCULAR | Status: AC
  Filled 2012-09-03: qty 3

## 2012-09-03 MED ORDER — GLYCOPYRROLATE 0.2 MG/ML IJ SOLN
INTRAMUSCULAR | Status: DC | PRN
  Administered 2012-09-03: 0.2 mg via INTRAVENOUS
  Administered 2012-09-03: 0.4 mg via INTRAVENOUS

## 2012-09-03 MED ORDER — PROPOFOL 10 MG/ML IV EMUL
INTRAVENOUS | Status: AC
  Filled 2012-09-03: qty 20

## 2012-09-03 MED ORDER — ONDANSETRON HCL 4 MG/2ML IJ SOLN
INTRAMUSCULAR | Status: AC
  Filled 2012-09-03: qty 2

## 2012-09-03 MED ORDER — KETOROLAC TROMETHAMINE 30 MG/ML IJ SOLN
INTRAMUSCULAR | Status: DC | PRN
  Administered 2012-09-03: 30 mg via INTRAVENOUS

## 2012-09-03 MED ORDER — PROMETHAZINE HCL 25 MG/ML IJ SOLN: 6.2500 mg | INTRAMUSCULAR | Status: DC | PRN

## 2012-09-03 MED ORDER — MIDAZOLAM HCL 5 MG/5ML IJ SOLN
INTRAMUSCULAR | Status: DC | PRN
  Administered 2012-09-03: 2 mg via INTRAVENOUS

## 2012-09-03 MED ORDER — DEXAMETHASONE SODIUM PHOSPHATE 10 MG/ML IJ SOLN
INTRAMUSCULAR | Status: DC | PRN
  Administered 2012-09-03: 10 mg via INTRAVENOUS

## 2012-09-03 MED ORDER — KETOROLAC TROMETHAMINE 30 MG/ML IJ SOLN
30.0000 mg | Freq: Four times a day (QID) | INTRAMUSCULAR | Status: DC
  Administered 2012-09-03 – 2012-09-04 (×2): 30 mg via INTRAVENOUS
  Filled 2012-09-03 (×2): qty 1

## 2012-09-03 MED ORDER — LACTATED RINGERS IV SOLN
INTRAVENOUS | Status: DC
  Administered 2012-09-03 (×3): via INTRAVENOUS

## 2012-09-03 MED ORDER — KETOROLAC TROMETHAMINE 30 MG/ML IJ SOLN: 15.0000 mg | Freq: Once | INTRAMUSCULAR | Status: DC | PRN

## 2012-09-03 MED ORDER — MENTHOL 3 MG MT LOZG: 1.0000 | LOZENGE | OROMUCOSAL | Status: DC | PRN

## 2012-09-03 MED ORDER — DEXAMETHASONE SODIUM PHOSPHATE 10 MG/ML IJ SOLN
INTRAMUSCULAR | Status: AC
  Filled 2012-09-03: qty 1

## 2012-09-03 SURGICAL SUPPLY — 90 items
ADH SKN CLS APL DERMABOND .7 (GAUZE/BANDAGES/DRESSINGS) ×2
BAG SPEC RTRVL LRG 6X4 10 (ENDOMECHANICALS)
BAG URINE DRAINAGE (UROLOGICAL SUPPLIES) ×3 IMPLANT
BARRIER ADHS 3X4 INTERCEED (GAUZE/BANDAGES/DRESSINGS) ×3 IMPLANT
BLADE LAPAROSCOPIC MORCELL KIT (BLADE) IMPLANT
BLADE SURG 15 STRL LF C SS BP (BLADE) ×2 IMPLANT
BLADE SURG 15 STRL SS (BLADE) ×3
BRR ADH 4X3 ABS CNTRL BYND (GAUZE/BANDAGES/DRESSINGS) ×2
CABLE HIGH FREQUENCY MONO STRZ (ELECTRODE) ×1 IMPLANT
CATH FOLEY 3WAY  5CC 16FR (CATHETERS) ×1
CATH FOLEY 3WAY 5CC 16FR (CATHETERS) ×2 IMPLANT
CHLORAPREP W/TINT 26ML (MISCELLANEOUS) ×3 IMPLANT
CLOTH BEACON ORANGE TIMEOUT ST (SAFETY) ×3 IMPLANT
CONT PATH 16OZ SNAP LID 3702 (MISCELLANEOUS) ×3 IMPLANT
COVER MAYO STAND STRL (DRAPES) ×3 IMPLANT
COVER TABLE BACK 60X90 (DRAPES) ×6 IMPLANT
COVER TIP SHEARS 8 DVNC (MISCELLANEOUS) ×2 IMPLANT
COVER TIP SHEARS 8MM DA VINCI (MISCELLANEOUS) ×1
DECANTER SPIKE VIAL GLASS SM (MISCELLANEOUS) ×3 IMPLANT
DERMABOND ADVANCED (GAUZE/BANDAGES/DRESSINGS) ×1
DERMABOND ADVANCED .7 DNX12 (GAUZE/BANDAGES/DRESSINGS) ×4 IMPLANT
DRAPE HUG U DISPOSABLE (DRAPE) ×3 IMPLANT
DRAPE LG THREE QUARTER DISP (DRAPES) ×6 IMPLANT
DRAPE WARM FLUID 44X44 (DRAPE) ×3 IMPLANT
ELECT NDL TIP 2.8 STRL (NEEDLE) ×2 IMPLANT
ELECT NEEDLE TIP 2.8 STRL (NEEDLE) ×3 IMPLANT
ELECT REM PT RETURN 9FT ADLT (ELECTROSURGICAL) ×3
ELECTRODE REM PT RTRN 9FT ADLT (ELECTROSURGICAL) ×2 IMPLANT
EVACUATOR SMOKE 8.L (FILTER) ×3 IMPLANT
FORCEPS CUTTING 33CM 5MM (CUTTING FORCEPS) IMPLANT
GAUZE VASELINE 3X9 (GAUZE/BANDAGES/DRESSINGS) IMPLANT
GLOVE BIO SURGEON STRL SZ 6.5 (GLOVE) ×3 IMPLANT
GLOVE BIOGEL PI IND STRL 7.0 (GLOVE) ×2 IMPLANT
GLOVE BIOGEL PI INDICATOR 7.0 (GLOVE) ×1
GOWN PREVENTION PLUS LG XLONG (DISPOSABLE) ×9 IMPLANT
GOWN STRL REIN XL XLG (GOWN DISPOSABLE) ×18 IMPLANT
IV STOPCOCK 4 WAY 40  W/Y SET (IV SOLUTION)
IV STOPCOCK 4 WAY 40 W/Y SET (IV SOLUTION) ×2 IMPLANT
KIT ACCESSORY DA VINCI DISP (KITS) ×1
KIT ACCESSORY DVNC DISP (KITS) ×2 IMPLANT
LEGGING LITHOTOMY PAIR STRL (DRAPES) ×3 IMPLANT
MANIPULATOR UTERINE 4.5 ZUMI (MISCELLANEOUS) IMPLANT
NEEDLE HYPO 22GX1.5 SAFETY (NEEDLE) ×1 IMPLANT
NEEDLE INSUFFLATION 120MM (ENDOMECHANICALS) IMPLANT
OCCLUDER COLPOPNEUMO (BALLOONS) ×1 IMPLANT
PACK LAPAROSCOPY BASIN (CUSTOM PROCEDURE TRAY) ×3 IMPLANT
PACK LAVH (CUSTOM PROCEDURE TRAY) ×3 IMPLANT
PAD PREP 24X48 CUFFED NSTRL (MISCELLANEOUS) ×6 IMPLANT
PENCIL BUTTON HOLSTER BLD 10FT (ELECTRODE) ×3 IMPLANT
PLUG CATH AND CAP STER (CATHETERS) ×3 IMPLANT
POUCH SPECIMEN RETRIEVAL 10MM (ENDOMECHANICALS) IMPLANT
PROTECTOR NERVE ULNAR (MISCELLANEOUS) ×6 IMPLANT
SCISSORS LAP 5X35 DISP (ENDOMECHANICALS) IMPLANT
SEALER TISSUE G2 CVD JAW 35 (ENDOMECHANICALS) IMPLANT
SEALER TISSUE G2 CVD JAW 45CM (ENDOMECHANICALS)
SET CYSTO W/LG BORE CLAMP LF (SET/KITS/TRAYS/PACK) IMPLANT
SET IRRIG TUBING LAPAROSCOPIC (IRRIGATION / IRRIGATOR) ×6 IMPLANT
SOLUTION ELECTROLUBE (MISCELLANEOUS) ×3 IMPLANT
STENT BALLN UTERINE 3CM 6FR (Stent) IMPLANT
STENT BALLN UTERINE 4CM 6FR (STENTS) IMPLANT
SUT VIC AB 0 CT1 27 (SUTURE) ×6
SUT VIC AB 0 CT1 27XBRD ANBCTR (SUTURE) ×4 IMPLANT
SUT VIC AB 0 CT1 27XBRD ANTBC (SUTURE) IMPLANT
SUT VIC AB 2-0 CT1 27 (SUTURE)
SUT VIC AB 2-0 CT1 TAPERPNT 27 (SUTURE) IMPLANT
SUT VIC AB 4-0 PS2 27 (SUTURE) ×6 IMPLANT
SUT VICRYL 0 27 CT2 27 ABS (SUTURE) IMPLANT
SUT VICRYL 0 UR6 27IN ABS (SUTURE) ×6 IMPLANT
SUT VICRYL 4-0 PS2 18IN ABS (SUTURE) ×2 IMPLANT
SYR 50ML LL SCALE MARK (SYRINGE) ×3 IMPLANT
SYR 5ML LL (SYRINGE) ×3 IMPLANT
SYSTEM CONVERTIBLE TROCAR (TROCAR) IMPLANT
TIP RUMI ORANGE 6.7MMX12CM (TIP) IMPLANT
TIP UTERINE 5.1X6CM LAV DISP (MISCELLANEOUS) IMPLANT
TIP UTERINE 6.7X10CM GRN DISP (MISCELLANEOUS) IMPLANT
TIP UTERINE 6.7X6CM WHT DISP (MISCELLANEOUS) ×1 IMPLANT
TIP UTERINE 6.7X8CM BLUE DISP (MISCELLANEOUS) IMPLANT
TOWEL OR 17X24 6PK STRL BLUE (TOWEL DISPOSABLE) ×9 IMPLANT
TRAY FOLEY CATH 14FR (SET/KITS/TRAYS/PACK) ×3 IMPLANT
TROCAR 12M 150ML BLUNT (TROCAR) ×1 IMPLANT
TROCAR BALLN 12MMX100 BLUNT (TROCAR) IMPLANT
TROCAR DISP BLADELESS 8 DVNC (TROCAR) ×2 IMPLANT
TROCAR DISP BLADELESS 8MM (TROCAR) ×1
TROCAR HASSON GELL 12X100 (TROCAR) IMPLANT
TROCAR XCEL 12X100 BLDLESS (ENDOMECHANICALS) ×3 IMPLANT
TROCAR XCEL NON-BLD 11X100MML (ENDOMECHANICALS) IMPLANT
TROCAR XCEL NON-BLD 5MMX100MML (ENDOMECHANICALS) ×6 IMPLANT
TUBING FILTER THERMOFLATOR (ELECTROSURGICAL) ×6 IMPLANT
WARMER LAPAROSCOPE (MISCELLANEOUS) ×3 IMPLANT
WATER STERILE IRR 1000ML POUR (IV SOLUTION) ×9 IMPLANT

## 2012-09-03 NOTE — Anesthesia Preprocedure Evaluation (Signed)
Anesthesia Evaluation  Patient identified by MRN, date of birth, ID band Patient awake    Reviewed: Allergy & Precautions, H&P , Patient's Chart, lab work & pertinent test results, reviewed documented beta blocker date and time   History of Anesthesia Complications Negative for: history of anesthetic complications  Airway Mallampati: III TM Distance: >3 FB Neck ROM: full    Dental no notable dental hx.    Pulmonary neg pulmonary ROS,  breath sounds clear to auscultation  Pulmonary exam normal       Cardiovascular Exercise Tolerance: Good negative cardio ROS  Rhythm:regular Rate:Normal     Neuro/Psych PSYCHIATRIC DISORDERS negative neurological ROS  negative psych ROS   GI/Hepatic negative GI ROS, Neg liver ROS, GERD-  ,  Endo/Other  negative endocrine ROSHypothyroidism Morbid obesity  Renal/GU negative Renal ROS     Musculoskeletal   Abdominal   Peds  Hematology negative hematology ROS (+)   Anesthesia Other Findings Insomnia     Obesity        Hyperplastic colon polyp 12/04/09   Hip pain   chronic pain , WC    Back pain   WC Wrist pain 2008 WC    Anxiety and depression 06/22/2007   Hypothyroidism           Reproductive/Obstetrics negative OB ROS                           Anesthesia Physical Anesthesia Plan  ASA: III  Anesthesia Plan: General ETT   Post-op Pain Management:    Induction:   Airway Management Planned:   Additional Equipment:   Intra-op Plan:   Post-operative Plan:   Informed Consent: I have reviewed the patients History and Physical, chart, labs and discussed the procedure including the risks, benefits and alternatives for the proposed anesthesia with the patient or authorized representative who has indicated his/her understanding and acceptance.   Dental Advisory Given  Plan Discussed with: CRNA and Surgeon  Anesthesia Plan Comments:          Anesthesia Quick Evaluation

## 2012-09-03 NOTE — Preoperative (Addendum)
Beta Blockers   Reason not to administer Beta Blockers:Not Applicable 

## 2012-09-03 NOTE — H&P (Signed)
See scanned docs for full H&P  In short bothersome fibroids, G0 for hysterectomy with b/l salpingectomy. Pt aware risks of morcellation as well as fertility implications. FDA statement, spreading of possible sarcoma with worsening of prognosis,l as well as inability to diagnose sarcoma pre-op d/w pt. Pt agrees though most likely should be able to delivery uterus vaginally.  No other changes to past medical, surgical history, meds or allergies. Stopped aygestin over weekend, no bleeding.  Teresa Gilbert A. 09/03/2012 12:47 PM

## 2012-09-03 NOTE — Brief Op Note (Signed)
09/03/2012  4:00 PM  PATIENT:  Teresa Gilbert  37 y.o. female  PRE-OPERATIVE DIAGNOSIS:  Fibroids    POST-OPERATIVE DIAGNOSIS:  fibroids  PROCEDURE:  Procedure(s): ROBOTIC ASSISTED TOTAL HYSTERECTOMY (N/A) LAPAROSCOPIC BILATERAL SALPINGECTOMY (Bilateral)  SURGEON:  Surgeon(s) and Role:    Tresa Endo A. Ernestina Penna, MD - Primary    * Robley Fries, MD - Assisting  PHYSICIAN ASSISTANT:   ASSISTANTS: Mody   ANESTHESIA:   general  EBL:  Total I/O In: 1500 [I.V.:1500] Out: 450 [Urine:400; Blood:50]  BLOOD ADMINISTERED:none  DRAINS: Urinary Catheter (Foley)   LOCAL MEDICATIONS USED:  MARCAINE     SPECIMEN:  Source of Specimen:  uterus, cervix, bilateral fallopian tubes  DISPOSITION OF SPECIMEN:  PATHOLOGY  COUNTS:  YES  TOURNIQUET:  * No tourniquets in log *  DICTATION: .Note written in EPIC  PLAN OF CARE: Admit for overnight observation  PATIENT DISPOSITION:  PACU - hemodynamically stable.   Delay start of Pharmacological VTE agent (>24hrs) due to surgical blood loss or risk of bleeding: yes

## 2012-09-03 NOTE — Transfer of Care (Signed)
Immediate Anesthesia Transfer of Care Note  Patient: Teresa Gilbert  Procedure(s) Performed: Procedure(s): ROBOTIC ASSISTED TOTAL HYSTERECTOMY (N/A) LAPAROSCOPIC BILATERAL SALPINGECTOMY (Bilateral)  Patient Location: PACU  Anesthesia Type:General  Level of Consciousness: awake, alert  and oriented  Airway & Oxygen Therapy: Patient Spontanous Breathing and Patient connected to nasal cannula oxygen  Post-op Assessment: Report given to PACU RN and Post -op Vital signs reviewed and stable  Post vital signs: Reviewed and stable  Complications: No apparent anesthesia complications

## 2012-09-03 NOTE — Anesthesia Postprocedure Evaluation (Signed)
  Anesthesia Post Note  Patient: Teresa Gilbert  Procedure(s) Performed: Procedure(s) (LRB): ROBOTIC ASSISTED TOTAL HYSTERECTOMY (N/A) LAPAROSCOPIC BILATERAL SALPINGECTOMY (Bilateral)  Anesthesia type: GA  Patient location: PACU  Post pain: Pain level controlled  Post assessment: Post-op Vital signs reviewed  Last Vitals:  Filed Vitals:   09/03/12 1715  BP:   Pulse:   Temp: 37.1 C  Resp:     Post vital signs: Reviewed  Level of consciousness: sedated  Complications: No apparent anesthesia complications

## 2012-09-03 NOTE — Op Note (Signed)
09/03/2012  4:00 PM  PATIENT:  Teresa Gilbert  37 y.o. female  PRE-OPERATIVE DIAGNOSIS:  Fibroids    POST-OPERATIVE DIAGNOSIS:  fibroids  PROCEDURE:  Procedure(s): ROBOTIC ASSISTED TOTAL HYSTERECTOMY (N/A) LAPAROSCOPIC BILATERAL SALPINGECTOMY (Bilateral)  SURGEON:  Surgeon(s) and Role:    Tresa Endo A. Ernestina Penna, MD - Primary    * Robley Fries, MD - Assisting  PHYSICIAN ASSISTANT:   ASSISTANTS: Mody   ANESTHESIA:   general  EBL:  Total I/O In: 1500 [I.V.:1500] Out: 450 [Urine:400; Blood:50]  BLOOD ADMINISTERED:none  DRAINS: Urinary Catheter (Foley)   LOCAL MEDICATIONS USED:  MARCAINE     SPECIMEN:  Source of Specimen:  uterus, cervix, bilateral fallopian tubes  DISPOSITION OF SPECIMEN:  PATHOLOGY  COUNTS:  YES  TOURNIQUET:  * No tourniquets in log *  DICTATION: .Note written in EPIC  PLAN OF CARE: Admit for overnight observation  PATIENT DISPOSITION:  PACU - hemodynamically stable.   Delay start of Pharmacological VTE agent (>24hrs) due to surgical blood loss or risk of bleeding: yes Abx: 2g Ancef Findings: normal ovaries and tubes, 231g fibroid uteerus, nl liver edge, nl gallbladder, hemostasis post-procedure  Procedure:Robotic-assisted total laparoscopic hysterectomy  Indications: This is a 37 yo with  bothersome menorrhagia associated with anemia and mass symptoms from known fibroids. After all options have been reviewed with the patient , patient opted for preprocedure Lupron to help shrink the fibroid and help resolve her anemia. After several months of Lupron patient now presents for total laparoscopic hysterectomy with bilateral salpingectomy. Given that she is premenopausal she opted for ovarian conservation.  Procedure: After informed consent and discussion of alternatives to hysterectomy, the patient was taken to the operating room where general anesthesia was initiated without difficulty. She was prepped and draped in normal sterile fashion in the  dorsal supine lithotomy position. A Foley catheter was inserted sterilely into the bladder. A bimanual examination was done to assess the size and position of the uterus. A weighted speculum was placed in the vagina. A vaginal wall retractor was used on the anterior vagina and  the cervix was grasped with tenaculum. The cervix was sounded with the uterine sound. It sounded to 6 cm. The cervix was assessed to identify the Rumi-Co size.A small cup and a 6 cm shaft was used. This was easily threaded through the cervix, into the uterus and the cup seated nicely around the cervix. 2 sutures were also placed and the anterior and posterior cervix to help affix the Koh-ring. The uterine balloon was inflated.  Gloved were changed. Attention was then turned to the patient's abdomen. 0.5 % marcaine was used prior to all incision. A total of 20 cc of marcaine was used.  A 10 mm incision was made in the umbilicus and blunt and sharp dissection was done until the fascia was identified. This was then grasped with Kocher clamps x2 and entered sharply. A pursestring suture of 0 Vicryl was then placed along the incision and a non-bladed Roseanne Reno was inserted into the peritoneal cavity. Intraperitoneal placement was confirmed with the use of the camera and pneumoperitoneum was created to 15 mm of mercury. The pursestring suture was secured around the port and pneumoperitoneum was maintained. Brief survey of the abdomen and pelvis was done with findings as above. The abdominal wall was assessed and additional port sites were marked. 8 mm incisions were placed in the right (two) and left lower quadrants (1) and non-bladed ports were placed under direct visualization. An additional 5 mm  incision was made in LLQ and 5mm non-bladed port was placed under direct visualization.  The robot was brought to the patient's side and attached with the right side docking. The robotic instruments were placed under direct visualization until proper  placement just over the uterus.  I then went to the robotic console. Brief survey of the patient's abdomen and pelvis revealed  findings as above. Bilateral ureters were identified and seen peristalsing well low in the pelvic sidewall. Bilateral tubes and ovaries were normal. No significant adhesions were noted. I began on the left side: the  Left round ligament was serially cauterized then transected. The left tube was serially cauterized with bipolar cautery with the use of the PK device and transected with the monopolar scissors. The left utero-ovarian ligament was serially cauterized with the bipolar PK and transected with the monopolar scissors. The anterior leaf of the broad ligament was dissected bluntly then monopolar cautery was used to separate the anterior and posterior leafs of the broad ligament. This was carried down through to the bladder flap. Attention was then turned to the patient's right side: the right round ligament was cauterized then divided, the right tube was cauterized and transected,  the right  uterine ovarian ligament was serially cauterized with the PK and transected with the monopolar scissors. The right broad ligament was opened up the anterior and posterior leaves were dissected apart from each other. Monopolar cautery was used to come across the anterior leaf of the right broad ligament. This dissection was carried down across to the midline to create the bladder flap. The right uterine artery was serially cauterized with the PK and transected with the monopolar scissors. The left uterine artery and then serially cauterized with the PK and transected with the monopolar scissors. The uterus was placed on stretch to allow better visualization of the arteries. The bladder flap was further taken down both sharply and with cautery. Cautery was used for hemostasis on several places along the bladder flap. At this point with the pressure inward on the Rumi, the posterior  colpotomy was  made with the monopolar scissors. This was carried around to the patient's left side. Additional bipolar cautery was needed on the  both angles of the cuff- this was carried out with the PK bipolar. The uterus was then positioned  posteriorly  to allow easy access to the  anterior  colpotomy. This was carried out with the monopolar scissors.  Good hemostasis was noted along the angles of the incision.  The uterine manipulator and specimen was removed from the vagina.   Irrigation was used and the vaginal cuff appeared hemostatic. Several 0 Vicryl on a CT-2 sutures were dropped through the umbilical port and tethered to the side wall.   Instruments were changed to allow a suture cut needle driver through the #1 port and and a long-tipped forceps through the #2 port. The right angle was closed in a figure-of-eight fashion.The left angle was closed with a figure-of-eight suture. 4 additional sutures were then thrown along the vaginal cuff, all figure of 8 sutures with 0-vicryl. Excellent hemostasis was noted. Suction irrigation was carried out and hemostasis was assured along the vaginal cuff. The utero-ovarian ligaments were reassessed also found to be hemostatic. All free fluid was removed from the abdomen. The robotic portion was completed. The robot was undocked.   By standard laparoscopy the pelvis was irrigated and evaluated.  All sutures were removed. The patient was placed in reverse Trendelenburg, all additional fluid was  suctioned from the abdomen and pelvis the cuff was reinspected and  found to be hemostatic. All pedicles were also found to be hemostatic. A piece of Interceed was placed along the cuff. Under direct visualization the ports were removed. Pneumoperitoneum was released and the umbilical port was removed.  The  pursestring suture at the umbilicus was then closed. No additional fascial incisions were closed due to the small size and the nondilated nature of these incisions. A 4-0 Vicryl was  used to close the additional laparoscopic ports sites. Good hemostasis was noted.  Sponge lap and needle counts were correct x3 the patient was woken from general anesthesia having tolerated the procedure well and taken to the recovery room in a stable fashion.

## 2012-09-04 ENCOUNTER — Encounter (HOSPITAL_COMMUNITY): Payer: Self-pay | Admitting: Obstetrics

## 2012-09-04 LAB — CBC
MCHC: 34.7 g/dL (ref 30.0–36.0)
Platelets: 324 10*3/uL (ref 150–400)
RDW: 13.2 % (ref 11.5–15.5)
WBC: 13.6 10*3/uL — ABNORMAL HIGH (ref 4.0–10.5)

## 2012-09-04 NOTE — Anesthesia Postprocedure Evaluation (Signed)
  Anesthesia Post-op Note  Patient: Teresa Gilbert  Procedure(s) Performed: Procedure(s): ROBOTIC ASSISTED TOTAL HYSTERECTOMY (N/A) LAPAROSCOPIC BILATERAL SALPINGECTOMY (Bilateral)  Patient Location: [facility]  Anesthesia Type:General  Level of Consciousness: awake, alert  and oriented  Airway and Oxygen Therapy: Patient Spontanous Breathing  Post-op Pain: none  Post-op Assessment: Post-op Vital signs reviewed and Patient's Cardiovascular Status Stable  Post-op Vital Signs: Reviewed and stable  Complications: No apparent anesthesia complications

## 2012-09-04 NOTE — Discharge Summary (Signed)
Teresa Gilbert MRN: [id number] DOB/AGE: [birth year] 37 y.o.  Admit date: 09/03/2012 Discharge date: 09/04/12  Admission Diagnoses: Fibroids  40981  Discharge Diagnoses: Fibroids  19147        Active Problems:   * No active hospital problems. *   Discharged Condition: stable  Hospital Course: admitted for robotic assister TLH w/ b/l salpingectomy. Uncomplicated surgery with vaginal removal of specimen. Some post op nausea/ emesis that resolved through the day. On day of d/c, pain controlled, normal void, still no flatus/ BM though tolerated clears/ crackers and no nausea at d/c. Minimal vaginal bleeding. Exam on day of d/c w/ nl vitals, no CVAT, abdomen soft, ND, appropriately tender, no vaginal staining, incisions well approximated with dermabond  Consults: None  Treatments: surgery: TLH/ robotic assisted  Disposition: Final discharge disposition not confirmed     Medication List    STOP taking these medications       norethindrone 5 MG tablet  Commonly known as:  AYGESTIN      TAKE these medications       CULTURELLE Caps  Take 1 capsule by mouth daily.     ibuprofen 600 MG tablet  Commonly known as:  ADVIL,MOTRIN  Take 1 tablet (600 mg total) by mouth every 6 (six) hours as needed (mild pain).     oxyCODONE-acetaminophen 5-325 MG per tablet  Commonly known as:  PERCOCET/ROXICET  Take 1-2 tablets by mouth every 4 (four) hours as needed.     Thyroid 48.75 MG Tabs  Take 1 tablet by mouth daily before breakfast. Ashby Dawes Thyroid         Signed: Lendon Colonel., MD 09/04/2012, 2:26 PM

## 2012-11-12 ENCOUNTER — Ambulatory Visit (INDEPENDENT_AMBULATORY_CARE_PROVIDER_SITE_OTHER): Payer: BC Managed Care – PPO | Admitting: Internal Medicine

## 2012-11-12 ENCOUNTER — Encounter: Payer: Self-pay | Admitting: Internal Medicine

## 2012-11-12 VITALS — BP 151/91 | HR 62 | Temp 97.9°F | Wt 217.8 lb

## 2012-11-12 DIAGNOSIS — J069 Acute upper respiratory infection, unspecified: Secondary | ICD-10-CM

## 2012-11-12 DIAGNOSIS — M549 Dorsalgia, unspecified: Secondary | ICD-10-CM

## 2012-11-12 MED ORDER — HYDROCODONE-ACETAMINOPHEN 5-325 MG PO TABS: 1.0000 | ORAL_TABLET | Freq: Three times a day (TID) | ORAL | Status: DC | PRN

## 2012-11-12 MED ORDER — CYCLOBENZAPRINE HCL 10 MG PO TABS: 10.0000 mg | ORAL_TABLET | Freq: Every evening | ORAL | Status: DC | PRN

## 2012-11-12 NOTE — Progress Notes (Signed)
  Subjective:    Patient ID: Teresa Gilbert, female    DOB: Nov 26, 1975, 37 y.o.   MRN: [id number]  HPI Acute visit A week ago developed a URI: Fever for one day, sinus congestion, cough, chest congestion. Went to the urgent care a couple of days ago, was prescribed a shot (a steroid?) as well as Cefdinir which he has not started yet. Overall the upper respiratory symptoms are better. Yesterday he had a sudden onset of back pain, left-sided, some radiation to the buttock but not to the leg. Worse with bending, standing.  Past Medical History  Diagnosis Date  . Insomnia   . Obesity   . Hyperplastic colon polyp 12/04/09  . Hip pain     chronic pain , WC  . Back pain     WC  . Wrist pain 2008    WC  . Anxiety and depression 06/22/2007  . Hypothyroidism    Past Surgical History  Procedure Laterality Date  . Hip arthroscopy  2011    right  . Excision of urethral diverticulum  2010    Dr Patsi Sears  . Bunionectomy Right 09-2010     bunion, toes -- per podiatry  . Robotic assisted total hysterectomy N/A 09/03/2012    Procedure: ROBOTIC ASSISTED TOTAL HYSTERECTOMY;  Surgeon: Tresa Endo A. Ernestina Penna, MD;  Location: [facility];  Service: Gynecology;  Laterality: N/A;  . Laparoscopic bilateral salpingectomy Bilateral 09/03/2012    Procedure: LAPAROSCOPIC BILATERAL SALPINGECTOMY;  Surgeon: Tresa Endo A. Ernestina Penna, MD;  Location: [facility];  Service: Gynecology;  Laterality: Bilateral;  . Bunionectomy Right 09-2012    revision    Review of Systems Denies any recent back injury, rash in the back. No lower extremity paresthesias. Again, had fever last week but not recently. No dysuria gross hematuria.     Objective:   Physical Exam General -- alert, well-developed, NAD.  Abdomen-- Not distended, good bowel sounds,soft, non-tender. No rebound or rigidity.  Extremities-- no pretibial edema bilaterally, not tender at the trochanteric bursa is Neurologic--  alert & oriented X3. Speech normal, gait& posture  antalgic, strength normal in all extremities. DTRs symmetric, Straight leg test negative Psych-- Cognition and judgment appear intact. Cooperative with normal attention span and concentration. No anxious appearing , no depressed appearing.     Assessment & Plan:  URI, Not toxic appearing, recommend to take the antibiotic as prescribed by the urgent care.  Back pain, Some radicular features but neurological exam normal, will treat conservatively. See instructions.

## 2012-11-12 NOTE — Patient Instructions (Addendum)
Rest, no heavy  lifting. Use a heating pad as needed. Motrin 200 mg 2 tablets every 6 hours as needed for pain. Always take it with food because may cause gastritis and ulcers. If you notice nausea, stomach pain, change in the color of stools --->  Stop the medicine and let us know If the pain persists, use hydrocodone, will cause drowsiness. Use Flexeril last night, this is a muscle relaxant. Call if not improving in few days, call anytime if pain severe, you have fever or chills. Also, take antibiotics as prescribed by the other doctor

## 2012-12-22 ENCOUNTER — Telehealth: Payer: Self-pay | Admitting: Internal Medicine

## 2012-12-22 DIAGNOSIS — R7989 Other specified abnormal findings of blood chemistry: Secondary | ICD-10-CM

## 2012-12-22 NOTE — Telephone Encounter (Signed)
Advise patient, due for labs. Enter orders: TSH, free T3, free T4 -- dx abnormal TSH. To be done at her earliest convenience

## 2012-12-24 NOTE — Telephone Encounter (Signed)
Labs ordered. Pt to call back to schedule lab visit. DJR  

## 2013-01-11 ENCOUNTER — Other Ambulatory Visit: Payer: Self-pay | Admitting: Urology

## 2013-01-29 ENCOUNTER — Encounter (HOSPITAL_BASED_OUTPATIENT_CLINIC_OR_DEPARTMENT_OTHER): Payer: Self-pay | Admitting: *Deleted

## 2013-01-29 NOTE — Progress Notes (Signed)
NPO AFTER MN. ARRIVE AT 0700.  NEEDS HG.  PT AWARE OWER AT MAIN.

## 2013-01-31 ENCOUNTER — Encounter (HOSPITAL_COMMUNITY)
Admission: RE | Disposition: A | Discharge status: Home / Self Care | Payer: Self-pay | Source: Ambulatory Visit | Attending: Urology

## 2013-01-31 ENCOUNTER — Ambulatory Visit (HOSPITAL_BASED_OUTPATIENT_CLINIC_OR_DEPARTMENT_OTHER): Payer: BC Managed Care – PPO | Admitting: Anesthesiology

## 2013-01-31 ENCOUNTER — Observation Stay (HOSPITAL_BASED_OUTPATIENT_CLINIC_OR_DEPARTMENT_OTHER)
Admission: RE | Admit: 2013-01-31 | Discharge: 2013-02-01 | Disposition: A | Discharge status: Home / Self Care | Payer: BC Managed Care – PPO | Source: Ambulatory Visit | Attending: Urology | Admitting: Urology

## 2013-01-31 ENCOUNTER — Encounter (HOSPITAL_BASED_OUTPATIENT_CLINIC_OR_DEPARTMENT_OTHER): Payer: BC Managed Care – PPO | Admitting: Anesthesiology

## 2013-01-31 ENCOUNTER — Encounter (HOSPITAL_BASED_OUTPATIENT_CLINIC_OR_DEPARTMENT_OTHER): Payer: Self-pay | Admitting: *Deleted

## 2013-01-31 DIAGNOSIS — Z90710 Acquired absence of both cervix and uterus: Secondary | ICD-10-CM | POA: Insufficient documentation

## 2013-01-31 DIAGNOSIS — K219 Gastro-esophageal reflux disease without esophagitis: Secondary | ICD-10-CM | POA: Insufficient documentation

## 2013-01-31 DIAGNOSIS — E039 Hypothyroidism, unspecified: Secondary | ICD-10-CM | POA: Insufficient documentation

## 2013-01-31 DIAGNOSIS — R32 Unspecified urinary incontinence: Secondary | ICD-10-CM | POA: Diagnosis present

## 2013-01-31 DIAGNOSIS — N393 Stress incontinence (female) (male): Principal | ICD-10-CM | POA: Insufficient documentation

## 2013-01-31 HISTORY — DX: Personal history of colonic polyps: Z86.010

## 2013-01-31 HISTORY — DX: Personal history of colon polyps, unspecified: Z86.0100

## 2013-01-31 HISTORY — DX: Other constipation: K59.09

## 2013-01-31 HISTORY — PX: PUBOVAGINAL SLING: SHX1035

## 2013-01-31 HISTORY — DX: Stress incontinence (female) (male): N39.3

## 2013-01-31 SURGERY — CREATION, PUBOVAGINAL SLING
Anesthesia: General | Site: Vagina

## 2013-01-31 MED ORDER — DIPHENHYDRAMINE HCL 12.5 MG/5ML PO ELIX
12.5000 mg | ORAL_SOLUTION | Freq: Four times a day (QID) | ORAL | Status: DC | PRN
Start: 2013-01-31 — End: 2013-02-01

## 2013-01-31 MED ORDER — CEFAZOLIN SODIUM-DEXTROSE 2-3 GM-% IV SOLR
2.0000 g | INTRAVENOUS | Status: DC
  Filled 2013-01-31: qty 50

## 2013-01-31 MED ORDER — CEFAZOLIN SODIUM-DEXTROSE 2-3 GM-% IV SOLR
INTRAVENOUS | Status: DC | PRN
  Administered 2013-01-31: 2 g via INTRAVENOUS

## 2013-01-31 MED ORDER — PHENAZOPYRIDINE HCL 100 MG PO TABS
ORAL_TABLET | ORAL | Status: AC
  Filled 2013-01-31: qty 1

## 2013-01-31 MED ORDER — ACETAMINOPHEN 500 MG PO TABS
1000.0000 mg | ORAL_TABLET | Freq: Four times a day (QID) | ORAL | Status: AC
  Administered 2013-01-31 – 2013-02-01 (×4): 1000 mg via ORAL
  Filled 2013-01-31 (×4): qty 2

## 2013-01-31 MED ORDER — LIDOCAINE HCL (CARDIAC) 20 MG/ML IV SOLN
INTRAVENOUS | Status: DC | PRN
  Administered 2013-01-31: 60 mg via INTRAVENOUS

## 2013-01-31 MED ORDER — OXYCODONE-ACETAMINOPHEN 5-325 MG PO TABS: 1.0000 | ORAL_TABLET | ORAL | Status: DC | PRN

## 2013-01-31 MED ORDER — LACTATED RINGERS IV SOLN
INTRAVENOUS | Status: DC | PRN
  Administered 2013-01-31 (×2): via INTRAVENOUS

## 2013-01-31 MED ORDER — MIDAZOLAM HCL 2 MG/2ML IJ SOLN
INTRAMUSCULAR | Status: AC
  Filled 2013-01-31: qty 2

## 2013-01-31 MED ORDER — BELLADONNA ALKALOIDS-OPIUM 16.2-60 MG RE SUPP
RECTAL | Status: AC
  Filled 2013-01-31: qty 1

## 2013-01-31 MED ORDER — PHENAZOPYRIDINE HCL 200 MG PO TABS
200.0000 mg | ORAL_TABLET | Freq: Once | ORAL | Status: AC
  Administered 2013-01-31: 200 mg via ORAL
  Filled 2013-01-31: qty 1

## 2013-01-31 MED ORDER — CIPROFLOXACIN HCL 500 MG PO TABS: 500.0000 mg | ORAL_TABLET | Freq: Two times a day (BID) | ORAL | Status: DC

## 2013-01-31 MED ORDER — LACTATED RINGERS IV SOLN
INTRAVENOUS | Status: DC
  Filled 2013-01-31: qty 1000

## 2013-01-31 MED ORDER — CIPROFLOXACIN HCL 500 MG PO TABS
500.0000 mg | ORAL_TABLET | Freq: Two times a day (BID) | ORAL | Status: DC
  Administered 2013-02-01: 500 mg via ORAL
  Filled 2013-01-31 (×2): qty 1

## 2013-01-31 MED ORDER — BELLADONNA ALKALOIDS-OPIUM 16.2-60 MG RE SUPP
RECTAL | Status: DC | PRN
  Administered 2013-01-31: 1 via RECTAL

## 2013-01-31 MED ORDER — LACTATED RINGERS IV SOLN
INTRAVENOUS | Status: DC
  Administered 2013-01-31: 08:00:00 via INTRAVENOUS
  Filled 2013-01-31: qty 1000

## 2013-01-31 MED ORDER — HYDROMORPHONE HCL PF 1 MG/ML IJ SOLN: 0.5000 mg | INTRAMUSCULAR | Status: DC | PRN

## 2013-01-31 MED ORDER — DEXAMETHASONE SODIUM PHOSPHATE 4 MG/ML IJ SOLN
INTRAMUSCULAR | Status: DC | PRN
  Administered 2013-01-31: 10 mg via INTRAVENOUS

## 2013-01-31 MED ORDER — FENTANYL CITRATE 0.05 MG/ML IJ SOLN
INTRAMUSCULAR | Status: AC
  Filled 2013-01-31: qty 6

## 2013-01-31 MED ORDER — ONDANSETRON HCL 4 MG/2ML IJ SOLN
INTRAMUSCULAR | Status: DC | PRN
  Administered 2013-01-31: 4 mg via INTRAVENOUS

## 2013-01-31 MED ORDER — ONDANSETRON HCL 4 MG/2ML IJ SOLN: 4.0000 mg | INTRAMUSCULAR | Status: DC | PRN

## 2013-01-31 MED ORDER — KETOROLAC TROMETHAMINE 30 MG/ML IJ SOLN
INTRAMUSCULAR | Status: DC | PRN
  Administered 2013-01-31: 30 mg via INTRAVENOUS

## 2013-01-31 MED ORDER — HYDROMORPHONE HCL PF 1 MG/ML IJ SOLN
0.2500 mg | INTRAMUSCULAR | Status: DC | PRN
  Filled 2013-01-31: qty 1

## 2013-01-31 MED ORDER — DEXTROSE-NACL 5-0.45 % IV SOLN
INTRAVENOUS | Status: DC
  Administered 2013-01-31: 14:00:00 via INTRAVENOUS

## 2013-01-31 MED ORDER — DIPHENHYDRAMINE HCL 50 MG/ML IJ SOLN: 12.5000 mg | Freq: Four times a day (QID) | INTRAMUSCULAR | Status: DC | PRN

## 2013-01-31 MED ORDER — STERILE WATER FOR IRRIGATION IR SOLN
Status: DC | PRN
  Administered 2013-01-31: 3000 mL via INTRAVESICAL

## 2013-01-31 MED ORDER — SODIUM CHLORIDE 0.9 % IJ SOLN
INTRAMUSCULAR | Status: DC | PRN
  Administered 2013-01-31: 50 mL via INTRAVENOUS

## 2013-01-31 MED ORDER — ZOLPIDEM TARTRATE 5 MG PO TABS: 5.0000 mg | ORAL_TABLET | Freq: Every evening | ORAL | Status: DC | PRN

## 2013-01-31 MED ORDER — PROPOFOL 10 MG/ML IV BOLUS
INTRAVENOUS | Status: DC | PRN
  Administered 2013-01-31: 200 mg via INTRAVENOUS

## 2013-01-31 MED ORDER — FENTANYL CITRATE 0.05 MG/ML IJ SOLN
INTRAMUSCULAR | Status: DC | PRN
  Administered 2013-01-31: 25 ug via INTRAVENOUS
  Administered 2013-01-31: 50 ug via INTRAVENOUS
  Administered 2013-01-31 (×5): 25 ug via INTRAVENOUS

## 2013-01-31 MED ORDER — KETOROLAC TROMETHAMINE 30 MG/ML IJ SOLN
30.0000 mg | Freq: Four times a day (QID) | INTRAMUSCULAR | Status: DC
  Administered 2013-01-31 – 2013-02-01 (×4): 30 mg via INTRAVENOUS
  Filled 2013-01-31 (×6): qty 1

## 2013-01-31 MED ORDER — SODIUM CHLORIDE 0.9 % IR SOLN
Status: DC | PRN
  Administered 2013-01-31: 10:00:00

## 2013-01-31 MED ORDER — BUPIVACAINE-EPINEPHRINE 0.5% -1:200000 IJ SOLN
INTRAMUSCULAR | Status: DC | PRN
  Administered 2013-01-31: 30 mL

## 2013-01-31 MED ORDER — PROMETHAZINE HCL 25 MG/ML IJ SOLN
6.2500 mg | INTRAMUSCULAR | Status: DC | PRN
  Filled 2013-01-31: qty 1

## 2013-01-31 MED ORDER — MIDAZOLAM HCL 5 MG/5ML IJ SOLN
INTRAMUSCULAR | Status: DC | PRN
  Administered 2013-01-31 (×2): 1 mg via INTRAVENOUS

## 2013-01-31 MED ORDER — BISACODYL 10 MG RE SUPP: 10.0000 mg | Freq: Every day | RECTAL | Status: DC | PRN

## 2013-01-31 SURGICAL SUPPLY — 58 items
ADH SKN CLS APL DERMABOND .7 (GAUZE/BANDAGES/DRESSINGS) ×1
BAG URINE DRAINAGE (UROLOGICAL SUPPLIES) ×2 IMPLANT
BLADE SURG 10 STRL SS (BLADE) ×2 IMPLANT
BLADE SURG 15 STRL LF DISP TIS (BLADE) ×1 IMPLANT
BLADE SURG 15 STRL SS (BLADE) ×2
BLADE SURG ROTATE 9660 (MISCELLANEOUS) ×2 IMPLANT
BOOTIES KNEE HIGH SLOAN (MISCELLANEOUS) ×2 IMPLANT
CANISTER SUCTION 1200CC (MISCELLANEOUS) IMPLANT
CANISTER SUCTION 2500CC (MISCELLANEOUS) ×4 IMPLANT
CATH FOLEY 2WAY SLVR  5CC 16FR (CATHETERS) ×1
CATH FOLEY 2WAY SLVR 5CC 16FR (CATHETERS) ×1 IMPLANT
CLOTH BEACON ORANGE TIMEOUT ST (SAFETY) ×2 IMPLANT
COVER MAYO STAND STRL (DRAPES) ×2 IMPLANT
COVER TABLE BACK 60X90 (DRAPES) ×2 IMPLANT
DERMABOND ADVANCED (GAUZE/BANDAGES/DRESSINGS) ×1
DERMABOND ADVANCED .7 DNX12 (GAUZE/BANDAGES/DRESSINGS) IMPLANT
DEVICE CAPIO SLIM BOX (INSTRUMENTS) IMPLANT
DISSECTOR ROUND CHERRY 3/8 STR (MISCELLANEOUS) IMPLANT
DRAPE CAMERA CLOSED 9X96 (DRAPES) ×2 IMPLANT
DRAPE UNDERBUTTOCKS STRL (DRAPE) ×2 IMPLANT
FLOSEAL 10ML (HEMOSTASIS) IMPLANT
GAUZE SPONGE 4X4 16PLY XRAY LF (GAUZE/BANDAGES/DRESSINGS) IMPLANT
GLOVE BIO SURGEON STRL SZ7.5 (GLOVE) ×2 IMPLANT
GOWN PREVENTION PLUS LG XLONG (DISPOSABLE) ×3 IMPLANT
GOWN STRL REIN XL XLG (GOWN DISPOSABLE) ×2 IMPLANT
NDL 1/2 CIR CATGUT .05X1.09 (NEEDLE) IMPLANT
NEEDLE 1/2 CIR CATGUT .05X1.09 (NEEDLE) IMPLANT
NEEDLE HYPO 22GX1.5 SAFETY (NEEDLE) ×4 IMPLANT
PACKING VAGINAL (PACKING) ×2 IMPLANT
PENCIL BUTTON HOLSTER BLD 10FT (ELECTRODE) ×2 IMPLANT
PLUG CATH AND CAP STER (CATHETERS) ×2 IMPLANT
RETRACTOR LONRSTAR 16.6X16.6CM (MISCELLANEOUS) ×1 IMPLANT
RETRACTOR STAY HOOK 5MM (MISCELLANEOUS) ×2 IMPLANT
RETRACTOR STER APS 16.6X16.6CM (MISCELLANEOUS) ×2
SET IRRIG Y TYPE TUR BLADDER L (SET/KITS/TRAYS/PACK) ×2 IMPLANT
SHEET LAVH (DRAPES) ×2 IMPLANT
SLING LYNX SUPRAPUBIC (Sling) ×1 IMPLANT
SLING SOLYX SYSTEM SIS BX (SLING) IMPLANT
SPONGE LAP 4X18 X RAY DECT (DISPOSABLE) ×1 IMPLANT
SUCTION FRAZIER TIP 10 FR DISP (SUCTIONS) ×2 IMPLANT
SUT ABS MONO DBL WITH NDL 48IN (SUTURE) IMPLANT
SUT ETHILON 2 0 PS N (SUTURE) IMPLANT
SUT MON AB 2-0 SH 27 (SUTURE)
SUT MON AB 2-0 SH27 (SUTURE) IMPLANT
SUT NONABSORB MONO DB W/NDL 48 (SUTURE) IMPLANT
SUT PDS AB 3-0 SH 27 (SUTURE) IMPLANT
SUT SILK 3 0 PS 1 (SUTURE) ×2 IMPLANT
SUT VIC AB 0 CT1 36 (SUTURE) IMPLANT
SUT VIC AB 2-0 CT1 27 (SUTURE)
SUT VIC AB 2-0 CT1 TAPERPNT 27 (SUTURE) IMPLANT
SUT VIC AB 2-0 UR6 27 (SUTURE) ×3 IMPLANT
SYR BULB IRRIGATION 50ML (SYRINGE) ×2 IMPLANT
SYR CONTROL 10ML LL (SYRINGE) ×3 IMPLANT
SYRINGE 10CC LL (SYRINGE) ×2 IMPLANT
TRAY DSU PREP LF (CUSTOM PROCEDURE TRAY) ×2 IMPLANT
TUBE CONNECTING 12X1/4 (SUCTIONS) ×4 IMPLANT
WATER STERILE IRR 500ML POUR (IV SOLUTION) ×2 IMPLANT
YANKAUER SUCT BULB TIP NO VENT (SUCTIONS) IMPLANT

## 2013-01-31 NOTE — Op Note (Signed)
Pre-operative diagnosis : Stress urinary incontinence with history of urethral diverticulectomy  Postoperative diagnosis:  Same with external urethral scar  Operation:  Urethroplasty with excision of obstructing urethral bands, Lynx pubovaginal sling  Surgeon:  S. Patsi Sears, MD  First assistant:  None  Anesthesia:  General LMA  Preparation: After appropriate preanesthesia, the patient was brought to the operating room, placed on the operating table in the dorsal supine position where general LMA anesthesia was introduced. She was then replaced in dorsal lithotomy position with pubis was prepped with Betadine solution and draped in usual fashion. The arm band was double checked. The history was double checked. History have been reviewed with the patient in the preoperative area as well.   Review history:  37 year old female returns today for a 4 mo f/u & to schedule surgery for a sling procedure for hx of incontinence. She is s/p hysterectomy on 09/03/12 for fibroid pain. She has previously failed anticholinergics: Detrol LA 4mg  & Myrbetriq. Hx of stress urinary incontinence. She states that her incontinence is about the same, using one pad a day for several months.  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 Pull out ureterogram was accomplished at [facility]. The catheter was reinserted at that time because of some extravasation of contrast into the period urethral tissue. The last pullout urethrogram on 6/16/ 2010 showed no extravasation of contrast.  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  Video urodynamics is accomplished on 11/09/11, in a sitting position, and shows a maximum capacity of 739 cc. The first sensation of fullness occurs at 35 cc (catheter introduced). Strong desire to void occurs at 540 cc. The patient does have a small unstable bladder contraction of 9 cm of water  at 405 cc, but no urinary leakage occurs.  Her cough leak point pressure at 200 cc is 54 cm water with mild leakage. A Valsalva leak point pressure at 200 cc is 78 cm water with mild leakage. There is some Valsalva-induced instability at high volumes.  Pressure flow study showed a maximum flow rate of 38 cc per second. The detrusor pressure at maximum flow was 26 cm water. PVR is scant.  This patient has a large bladder, with some low amplitude instability. There is mild stress urinary incontinence, with low leak point pressures. Some of her incontinence is triggered by coughing and induced instability.      Statement of  Likelihood of Success: Excellent. TIME-OUT observed.:  Procedure:  Urethral inspection under general anesthesia, with the [place] retractor in place revealed difficulty placing the Foley catheter, because of identifiable epithelial bands across the urethra and urethral meatus. These bands were dissected, and excised using the Bovie unit. No bleeding was noted. Once the bands were excised, 30 urethra was easily identifiable, and Foley catheter was easily placed. The urethra appeared well estrogenized, and the remaining vagina appeared to be within normal limits. There was no cystocele enterocele rectocele present.  The Foley balloon was marked with a blue marking pen. The mid urethra was marked with a blue marking pen. Areas of incision suprapubically were marked with a blue marking pen as well. Then, using Marcaine 2%, and epinephrine, 1 200,000, local anesthetic injection was accomplished for hydrodissection, and postoperative analgesia. Using a 10 blade, incisions were made suprapubically, measuring 0.5 cm each. Vaginally, a 2 cm mid urethral incision is made using a 15 blade,  With  submucosal dissection accomplished with the Strully scissors, on either side of the urethra. Visual inspection of the urethra is accomplished, to ensure that no urethral injury has occurred.  The  [employer] Rx needles are then placed retropubically, and brought through the vaginal incisions. Cystoscopy is accomplished, and cold urine is identified from each of the ureteral orifices, which were located in normal position on the trigone. There is no evidence of the needles within the bladder or the urethra. The bladder neck moves with the needle movement. The bladder is drained of fluid. The [place] mesh sling is then placed on the needles, and brought retropubically in standard fashion, with a large right angle clamp used for tensioning behind the midline. The blue button is then excise, and the plastic sleeves are removed simultaneously, with a right angle clamp behind the midline for tensioning purposes the clamp is opened easily, and after irrigation, visual inspection of the sling shows that the sling is smooth against the urethral wall, and loose against the urethral wall. With a right angle clamp behind the midline, the sides of the sleeves are then cut subcutaneously, and the excess mesh is removed. Irrigation is again accomplished. The vaginal wound is closed with running 2-0 Vicryl suture. No bleeding is noted. The suprapubic incisions were closed with Dermabond.  The patient was given IV Toradol. She was awakened, and taken to recovery room in good condition. Foley catheter will remain in place.

## 2013-01-31 NOTE — Anesthesia Preprocedure Evaluation (Signed)
Anesthesia Evaluation  Patient identified by MRN, date of birth, ID band Patient awake    Reviewed: Allergy & Precautions, H&P , Patient's Chart, lab work & pertinent test results, reviewed documented beta blocker date and time   History of Anesthesia Complications Negative for: history of anesthetic complications  Airway Mallampati: III TM Distance: >3 FB Neck ROM: full    Dental no notable dental hx. (+) Teeth Intact and Dental Advisory Given   Pulmonary neg pulmonary ROS,  breath sounds clear to auscultation  Pulmonary exam normal       Cardiovascular Exercise Tolerance: Good negative cardio ROS  Rhythm:regular Rate:Normal     Neuro/Psych PSYCHIATRIC DISORDERS negative neurological ROS  negative psych ROS   GI/Hepatic negative GI ROS, Neg liver ROS, GERD-  ,  Endo/Other  negative endocrine ROSHypothyroidism Morbid obesity  Renal/GU negative Renal ROS     Musculoskeletal   Abdominal   Peds  Hematology negative hematology ROS (+)   Anesthesia Other Findings Insomnia     Obesity        Hyperplastic colon polyp 12/04/09   Hip pain   chronic pain , WC    Back pain   WC Wrist pain 2008 WC    Anxiety and depression 06/22/2007   Hypothyroidism           Reproductive/Obstetrics negative OB ROS                           Anesthesia Physical Anesthesia Plan  ASA: II  Anesthesia Plan: General   Post-op Pain Management:    Induction: Intravenous  Airway Management Planned: LMA  Additional Equipment:   Intra-op Plan:   Post-operative Plan: Extubation in OR  Informed Consent: I have reviewed the patients History and Physical, chart, labs and discussed the procedure including the risks, benefits and alternatives for the proposed anesthesia with the patient or authorized representative who has indicated his/her understanding and acceptance.   Dental advisory given  Plan Discussed with:  CRNA  Anesthesia Plan Comments:         Anesthesia Quick Evaluation

## 2013-01-31 NOTE — Anesthesia Procedure Notes (Signed)
Procedure Name: LMA Insertion Date/Time: 01/31/2013 9:11 AM Performed by: Jessica Priest Pre-anesthesia Checklist: Patient identified, Emergency Drugs available, Suction available and Patient being monitored Patient Re-evaluated:Patient Re-evaluated prior to inductionOxygen Delivery Method: Circle System Utilized Preoxygenation: Pre-oxygenation with 100% oxygen Intubation Type: IV induction Ventilation: Mask ventilation without difficulty LMA: LMA inserted LMA Size: 4.0 Number of attempts: 1 Airway Equipment and Method: bite block Placement Confirmation: positive ETCO2 Tube secured with: Tape Dental Injury: Teeth and Oropharynx as per pre-operative assessment

## 2013-01-31 NOTE — Interval H&P Note (Signed)
History and Physical Interval Note:  01/31/2013 8:22 AM  Teresa Gilbert  has presented today for surgery, with the diagnosis of Stress Incontinence  The various methods of treatment have been discussed with the patient and family. After consideration of risks, benefits and other options for treatment, the patient has consented to  Procedure(s): PUBO-VAGINAL SLING (N/A) as a surgical intervention .  The patient's history has been reviewed, patient examined, no change in status, stable for surgery.  I have reviewed the patient's chart and labs.  Questions were answered to the patient's satisfaction.     [name] I

## 2013-01-31 NOTE — H&P (Signed)
roblems  1. Female stress incontinence (625.6)   Assessed By: [name] (Urology); Last Assessed: 09 Jan 2013  History of Present Illness       37 year old female returns today for a 4 mo f/u & to schedule surgery for a sling procedure for hx of incontinence. She is s/p hysterectomy on 09/03/12 for fibroid pain. She has previously failed anticholinergics: Detrol LA 4mg  & Myrbetriq. Hx of stress urinary incontinence. She states that her incontinence is about the same, using one pad a day for several months.      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 Pull out ureterogram was accomplished at [facility]. The catheter was reinserted at that time because of some extravasation of contrast into the period urethral tissue. The last pullout urethrogram on 6/16/ 2010 showed no extravasation of contrast.     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    Video urodynamics is accomplished on 11/09/11, in a sitting position, and shows a maximum capacity of 739 cc. The first sensation of fullness occurs at 35 cc (catheter introduced). Strong desire to void occurs at 540 cc. The patient does have a small unstable bladder contraction of 9 cm of water at 405 cc, but no urinary leakage occurs.    Her cough leak point pressure at 200 cc is 54 cm water with mild leakage. A Valsalva leak point pressure at 200 cc is 78 cm water with mild leakage. There is some Valsalva-induced instability at high volumes.    Pressure flow study showed a maximum flow rate of 38 cc per second. The detrusor pressure at maximum flow was 26 cm water. PVR is scant.    This patient has a large bladder, with some low amplitude instability. There is mild stress urinary incontinence, with low leak point pressures. Some of her incontinence is triggered by coughing and induced instability.   Past Medical  History Problems  1. History of uterine leiomyoma (V13.29)  Surgical History Problems  1. History of Excision Of Urethral Diverticulum - Female 2. History of Hip Arthroscopy 3. History of Hysterectomy  Current Meds 1. Chasteberry Extract POWD;  Therapy: (Recorded:10Feb2014) to Recorded 2. DHEA TABS;  Therapy: (Recorded:10Feb2014) to Recorded 3. Iron TABS;  Therapy: (Recorded:10Feb2014) to Recorded 4. Nature-Throid 32.5 MG Oral Tablet;  Therapy: (Recorded:10Feb2014) to Recorded 5. Pregnenolone POWD;  Therapy: (Recorded:10Feb2014) to Recorded 6. Vitamin B12 TABS;  Therapy: (Recorded:10Feb2014) to Recorded 7. Vitamin D3 1000 UNIT Oral Capsule;  Therapy: (Recorded:10Feb2014) to Recorded  Allergies Medication  1. No Known Drug Allergies  Family History Problems  1. Family history of Colon Cancer (V16.0) 2. Family history of Diabetes Mellitus (V18.0)  Social History Problems  1. Activities Of Daily Living 2. Denied: History of Alcohol Use 3. Daily Coffee Consumption (___ Cups/Day)   6 oz 4. Exercise Habits   No regular exercise however she indicates she is active at work and around the home     with all activities in light chores. 5. Living Independently 6. Marital History - Single 7. Never A Smoker 8. Occupation:   Nutritional therapist 9. Self-reliant In Usual Daily Activities 10. Denied: History of Tobacco Use  Review of Systems Genitourinary, constitutional, skin, eye, otolaryngeal, hematologic/lymphatic, cardiovascular, pulmonary, endocrine, musculoskeletal, gastrointestinal, neurological and psychiatric system(s) were reviewed and pertinent findings if present are noted.  Genitourinary: incontinence.    Vitals Vital Signs [Data Includes: Last  1 Day]  Recorded: 19Nov2014 03:43PM  Height: 5 ft 5 in Weight: 212 lb  BMI Calculated: 35.28 BSA Calculated: 2.03 Blood Pressure: 130 / 92 Heart Rate: 83  Physical Exam Constitutional: Well nourished and  well developed . No acute distress.  ENT:. The ears and nose are normal in appearance.  Neck: The appearance of the neck is normal and no neck mass is present.  Pulmonary: No respiratory distress and normal respiratory rhythm and effort.  Cardiovascular:. No peripheral edema.  Abdomen: The abdomen is mildly obese. The abdomen is soft and nontender. No masses are palpated. No CVA tenderness. No hernias are palpable. No hepatosplenomegaly noted.  Genitourinary:  Chaperone Present: kim lewis.  Examination of the external genitalia shows normal female external genitalia and no vulvar atrophy. The urethra is normal in appearance, not tender, does not appear stenotic and no urethral caruncle. There is no urethral mass. Urethral hypermobility is not present. There is no urethral discharge. No periurethral cyst is identified. There is no urethral prolapse. Vaginal exam demonstrates no abnormalities, no atrophy, no discharge, no tenderness, the vaginal epithelium to be well estrogenized and no uterine prolapse. No cystocele is identified. No enterocele is identified. The cervix is is without abnormalities. The uterus is without abnormalities. The adnexa are palpably normal. The bladder is tender, but normal on palpation.  Lymphatics: The femoral and inguinal nodes are not enlarged or tender.  Skin: Normal skin turgor, no visible rash and no visible skin lesions.  Neuro/Psych:. Mood and affect are appropriate.    Results/Data Urine [Data Includes: Last 1 Day]   19Nov2014  COLOR YELLOW   APPEARANCE CLEAR   SPECIFIC GRAVITY 1.010   pH 6.5   GLUCOSE NEG mg/dL  BILIRUBIN NEG   KETONE NEG mg/dL  BLOOD NEG   PROTEIN NEG mg/dL  UROBILINOGEN 0.2 mg/dL  NITRITE NEG   LEUKOCYTE ESTERASE TRACE   SQUAMOUS EPITHELIAL/HPF RARE   WBC 3-6 WBC/hpf  RBC 0-2 RBC/hpf  BACTERIA NONE SEEN   CRYSTALS NONE SEEN   CASTS NONE SEEN    Assessment Assessed  1. Female stress incontinence (625.6) 2. 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 She is several years post diverticulectomy, with severe incontinence. She is at higher risk for mesh sling, but I think she will need the sling to help her with such a low leak-point pressure. Her tissue looks healthy now.   Plan Female stress incontinence  1. Pelvic Exam; Status:Hold For - Manual Activation; Requested for:19Nov2014;  Health Maintenance  2. UA With REFLEX; Status:Resulted - Requires Verification;   Done: 19Nov2014 03:17PM  Lynx pubovaginal sling.   Discussion/Summary cc: Dr. Ernestina Penna     Signatures Electronically signed by : [name], M.D.; Jan 09 2013  4:16PM EST

## 2013-01-31 NOTE — Transfer of Care (Signed)
Immediate Anesthesia Transfer of Care Note  Patient: Teresa Gilbert  Procedure(s) Performed: Procedure(s) (LRB): URETHROPLASTY WITH EXCISION OF OBSTRUCTING MEATAL BAND; LYNX PUBO-VAGINAL SLING (N/A)  Patient Location: PACU  Anesthesia Type: General  Level of Consciousness: awake, sedated, patient cooperative and responds to stimulation  Airway & Oxygen Therapy: Patient Spontanous Breathing and Patient connected to face mask oxygen  Post-op Assessment: Report given to PACU RN, Post -op Vital signs reviewed and stable and Patient moving all extremities  Post vital signs: Reviewed and stable  Complications: No apparent anesthesia complications

## 2013-02-01 ENCOUNTER — Encounter (HOSPITAL_BASED_OUTPATIENT_CLINIC_OR_DEPARTMENT_OTHER): Payer: Self-pay | Admitting: Urology

## 2013-02-01 NOTE — Progress Notes (Signed)
Foley Catheter was discontinued at 0630. Patient voided 600 cc with a PVR of 150. No complaints at this time.  Orders for discharge were written, will prepare patient for discharge.

## 2013-02-01 NOTE — Discharge Summary (Signed)
Physician Discharge Summary  Patient ID: Teresa Gilbert MRN: [id number] DOB/AGE: [birth year] 37 y.o.  Admit date: 01/31/2013 Discharge date: 02/01/2013  Admission Diagnoses: Stress Incontinence  Discharge Diagnoses:  Active Problems:   Incontinence of urine in female   Discharged Condition:  improved  Hospital Course:   Surgery  Consults: none  Significant Diagnostic Studies: No results found.  Treatments: IV hydration and antibiotics: Ancef  Discharge Exam: Blood pressure 124/71, pulse 73, temperature 98.4 F (36.9 C), temperature source Oral, resp. rate 18, height 5\' 4"  (1.626 m), weight 97.977 kg (216 lb), last menstrual period 03/04/2012, SpO2 99.00%. General appearance: alert, cooperative and appears stated age  Disposition: 01-Home or Self Care  Discharge Orders   Future Orders Complete By Expires   Call MD for:  severe uncontrolled pain  As directed    Diet - low sodium heart healthy  As directed    Discharge instructions  As directed    Comments:     Anterior or Posterior Colporrhaphy, Sling Procedure Care After Refer to this sheet in the next few weeks. These instructions provide you with information on caring for yourself after your procedure. Your caregiver may also give you specific instructions. Your treatment has been planned according to current medical practices, but problems sometimes occur. Call your caregiver if you have any problems or questions after your procedure. HOME CARE INSTRUCTIONS Rest as much as possible the first 2 weeks.  Avoid heavy lifting (more than 10 pounds or 4.5 kg), pushing, or pulling. Limit stair climbing to once or twice a day the first week, then slowly increase this activity.  Avoid standing for prolonged periods of time.  Talk with your caregiver about when you may resume your usual physical activity.   You may resume your normal diet.  Limit fluids to 6 to 8 glasses of non-caffeinated beverages per day.  Eat a  well-balanced diet. Daily portions of food from the meat (protein), milk, fruit, vegetable, and bread families are necessary for your health.  Your normal bowel function should return. If constipation should occur, you may:   Take a mild laxative.  Add fruit and bran to your diet.  Drink more liquids.  If additional bladder care is required, you will receive instructions from your caregiver. Call your caregiver if you experience burning, urgency, or frequency on urination or if you are unable to completely empty your bladder.  You may take a shower and wash your hair.  Only take over-the-counter or prescription medicines for pain, fever, or discomfort as directed by your caregiver.  Clean the incision with water. Do not use a dressing unless the incision is draining or irritated. Check your incision daily for redness, draining, swelling, or separation of the skin. Call your caregiver if you notice any of these.  Keep your perineal area (the area between vagina and rectum) clean and dry. Perform perineal care after every bowel movement and each time you urinate. You may take a sitz bath or sit in a tub of clean, warm water when necessary, unless your caregiver tells you otherwise.  Do not have sexual intercourse until permitted by your caregiver.  It is still important to have a yearly pelvic exam.  SEEK MEDICAL CARE IF: You have shaking chills.  You have an oral temperature above 102 F (38.9 C).  Your pain is not relieved or becomes severe.  MAKE SURE YOU:  Understand these instructions.  Will watch your condition.  Will get help right away if you are  not doing well or get worse.  Document Released: 09/22/2003 Document Revised: 01/27/2011 Document Reviewed: 06/13/2007 ExitCare Patient Information 2012 [place], [facility]   Discharge patient  As directed    Discontinue IV  As directed    Increase activity slowly  As directed        Medication List         bisacodyl 10 MG suppository   Commonly known as:  DULCOLAX  Place 10 mg rectally as needed for moderate constipation.     ciprofloxacin 500 MG tablet  Commonly known as:  CIPRO  Take 1 tablet (500 mg total) by mouth 2 (two) times daily.     ibuprofen 200 MG tablet  Commonly known as:  ADVIL,MOTRIN  Take 200 mg by mouth every 6 (six) hours as needed.     multivitamin with minerals tablet  Take 1 tablet by mouth daily.     naproxen sodium 220 MG tablet  Commonly known as:  ANAPROX  Take 220 mg by mouth as needed.     oxyCODONE-acetaminophen 5-325 MG per tablet  Commonly known as:  ROXICET  Take 1 tablet by mouth every 4 (four) hours as needed for moderate pain or severe pain.     sodium phosphate enema  Commonly known as:  FLEET  Place 1 enema rectally as needed. follow package directions     Vitamin D3 1000 UNITS Caps  Take 1 capsule by mouth daily.           Follow-up Information   Follow up with Kathi Ludwig, MD.   Specialty:  Urology   Contact information:   [address] [place] [address] [phone]     D/c after voiding. Foley /packing out.   SignedJethro Bolus I 02/01/2013, 9:47 AM

## 2013-02-01 NOTE — Anesthesia Postprocedure Evaluation (Signed)
Anesthesia Post Note  Patient: Teresa Gilbert  Procedure(s) Performed: Procedure(s) (LRB): URETHROPLASTY WITH EXCISION OF OBSTRUCTING MEATAL BAND; LYNX PUBO-VAGINAL SLING (N/A)  Anesthesia type: General  Patient location: PACU  Post pain: Pain level controlled  Post assessment: Post-op Vital signs reviewed  Last Vitals:  Filed Vitals:   02/01/13 0501  BP: 124/71  Pulse: 73  Temp: 36.9 C  Resp: 18    Post vital signs: Reviewed  Level of consciousness: sedated  Complications: No apparent anesthesia complications

## 2013-02-01 NOTE — Care Management Note (Signed)
    Page 1 of 1   02/01/2013     10:54:34 AM   CARE MANAGEMENT NOTE 02/01/2013  Patient:  Teresa Gilbert, Teresa Gilbert   Account Number:  [id number]  Date Initiated:  02/01/2013  Documentation initiated by:  Lanier Clam  Subjective/Objective Assessment:   37 Y/O F ADMITTED W/INCONTINENCE OF URINE.     Action/Plan:   FROM HOME.   Anticipated DC Date:  02/01/2013   Anticipated DC Plan:  HOME/SELF CARE      DC Planning Services  CM consult      Choice offered to / List presented to:             Status of service:  Completed, signed off Medicare Important Message given?   (If response is "NO", the following Medicare IM given date fields will be blank) Date Medicare IM given:   Date Additional Medicare IM given:    Discharge Disposition:  HOME/SELF CARE  Per UR Regulation:  Reviewed for med. necessity/level of care/duration of stay  If discussed at Long Length of Stay Meetings, dates discussed:    Comments:  02/01/13 Aristide Waggle RN,BSN NCM [phone] D/C HOME NO D/C NEEDS OR ORDERS.

## 2013-07-26 ENCOUNTER — Ambulatory Visit (INDEPENDENT_AMBULATORY_CARE_PROVIDER_SITE_OTHER): Payer: BC Managed Care – PPO | Admitting: Physician Assistant

## 2013-07-26 ENCOUNTER — Encounter: Payer: Self-pay | Admitting: Physician Assistant

## 2013-07-26 VITALS — BP 102/80 | HR 92 | Temp 98.1°F | Resp 16 | Ht 64.0 in | Wt 218.5 lb

## 2013-07-26 DIAGNOSIS — J329 Chronic sinusitis, unspecified: Secondary | ICD-10-CM | POA: Insufficient documentation

## 2013-07-26 DIAGNOSIS — M940 Chondrocostal junction syndrome [Tietze]: Secondary | ICD-10-CM

## 2013-07-26 MED ORDER — AZITHROMYCIN 250 MG PO TABS: ORAL_TABLET | ORAL | Status: DC

## 2013-07-26 NOTE — Patient Instructions (Signed)
Please take antibiotic as directed.  Increase fluid intake.  Use Saline nasal spray.  Take a daily multivitamin. Get some over-the-counter Flonase and take as directed.  Place a humidifier in the bedroom.  Use Ibuprofen for chest wall tenderness -- I feel the cartilage is somewhat inflamed. Please call or return clinic if symptoms are not improving.  Sinusitis Sinusitis is redness, soreness, and swelling (inflammation) of the paranasal sinuses. Paranasal sinuses are air pockets within the bones of your face (beneath the eyes, the middle of the forehead, or above the eyes). In healthy paranasal sinuses, mucus is able to drain out, and air is able to circulate through them by way of your nose. However, when your paranasal sinuses are inflamed, mucus and air can become trapped. This can allow bacteria and other germs to grow and cause infection. Sinusitis can develop quickly and last only a short time (acute) or continue over a long period (chronic). Sinusitis that lasts for more than 12 weeks is considered chronic.  CAUSES  Causes of sinusitis include:  Allergies.  Structural abnormalities, such as displacement of the cartilage that separates your nostrils (deviated septum), which can decrease the air flow through your nose and sinuses and affect sinus drainage.  Functional abnormalities, such as when the small hairs (cilia) that line your sinuses and help remove mucus do not work properly or are not present. SYMPTOMS  Symptoms of acute and chronic sinusitis are the same. The primary symptoms are pain and pressure around the affected sinuses. Other symptoms include:  Upper toothache.  Earache.  Headache.  Bad breath.  Decreased sense of smell and taste.  A cough, which worsens when you are lying flat.  Fatigue.  Fever.  Thick drainage from your nose, which often is green and may contain pus (purulent).  Swelling and warmth over the affected sinuses. DIAGNOSIS  Your caregiver will  perform a physical exam. During the exam, your caregiver may:  Look in your nose for signs of abnormal growths in your nostrils (nasal polyps).  Tap over the affected sinus to check for signs of infection.  View the inside of your sinuses (endoscopy) with a special imaging device with a light attached (endoscope), which is inserted into your sinuses. If your caregiver suspects that you have chronic sinusitis, one or more of the following tests may be recommended:  Allergy tests.  Nasal culture A sample of mucus is taken from your nose and sent to a lab and screened for bacteria.  Nasal cytology A sample of mucus is taken from your nose and examined by your caregiver to determine if your sinusitis is related to an allergy. TREATMENT  Most cases of acute sinusitis are related to a viral infection and will resolve on their own within 10 days. Sometimes medicines are prescribed to help relieve symptoms (pain medicine, decongestants, nasal steroid sprays, or saline sprays).  However, for sinusitis related to a bacterial infection, your caregiver will prescribe antibiotic medicines. These are medicines that will help kill the bacteria causing the infection.  Rarely, sinusitis is caused by a fungal infection. In theses cases, your caregiver will prescribe antifungal medicine. For some cases of chronic sinusitis, surgery is needed. Generally, these are cases in which sinusitis recurs more than 3 times per year, despite other treatments. HOME CARE INSTRUCTIONS   Drink plenty of water. Water helps thin the mucus so your sinuses can drain more easily.  Use a humidifier.  Inhale steam 3 to 4 times a day (for example, sit  in the bathroom with the shower running).  Apply a warm, moist washcloth to your face 3 to 4 times a day, or as directed by your caregiver.  Use saline nasal sprays to help moisten and clean your sinuses.  Take over-the-counter or prescription medicines for pain, discomfort, or  fever only as directed by your caregiver. SEEK IMMEDIATE MEDICAL CARE IF:  You have increasing pain or severe headaches.  You have nausea, vomiting, or drowsiness.  You have swelling around your face.  You have vision problems.  You have a stiff neck.  You have difficulty breathing. MAKE SURE YOU:   Understand these instructions.  Will watch your condition.  Will get help right away if you are not doing well or get worse. Document Released: 02/07/2005 Document Revised: 05/02/2011 Document Reviewed: 02/22/2011 [facility] Patient Information 2014 [employer], [place].

## 2013-07-26 NOTE — Progress Notes (Signed)
Patient presents to clinic today c/o 3 wks of sinus pressure, sinus pain, post-nasal drip and non-producitve cough. Denies fever, chills, aches.  Denies SOB or wheezing. Endorses some chest tenderness 2/2 coughing. Denies recent travel or sick contact.  Past Medical History  Diagnosis Date  . SUI (stress urinary incontinence, female)   . History of colon polyps     HYPERPLASTIC  . Chronic constipation     Current Outpatient Prescriptions on File Prior to Visit  Medication Sig Dispense Refill  . bisacodyl (DULCOLAX) 10 MG suppository Place 10 mg rectally as needed for moderate constipation.      . Cholecalciferol (VITAMIN D3) 1000 UNITS CAPS Take 1 capsule by mouth daily.      Marland Kitchen ibuprofen (ADVIL,MOTRIN) 200 MG tablet Take 200 mg by mouth every 6 (six) hours as needed.      . Multiple Vitamins-Minerals (MULTIVITAMIN WITH MINERALS) tablet Take 1 tablet by mouth daily.      . naproxen sodium (ANAPROX) 220 MG tablet Take 220 mg by mouth as needed.      Marland Kitchen oxyCODONE-acetaminophen (ROXICET) 5-325 MG per tablet Take 1 tablet by mouth every 4 (four) hours as needed for moderate pain or severe pain.  30 tablet  0  . sodium phosphate (FLEET) enema Place 1 enema rectally as needed. follow package directions       No current facility-administered medications on file prior to visit.    No Known Allergies  Family History  Problem Relation Age of Onset  . Diabetes Mother   . Diabetes Maternal Aunt   . Diabetes      uncle  . Diabetes Cousin   . Colon cancer Paternal Grandmother   . Cancer Paternal Grandmother     colon  . Colon cancer Maternal Grandmother   . Lung cancer      grandfather  . Stroke Paternal Grandfather     dementia    History   Social History  . Marital Status: Single    Spouse Name: N/A    Number of Children: 0  . Years of Education: N/A   Occupational History  . special ed teacher    Social History Main Topics  . Smoking status: Never Smoker   . Smokeless  tobacco: Never Used  . Alcohol Use: No     Comment: rare  . Drug Use: No  . Sexual Activity: None   Other Topics Concern  . None   Social History Narrative   Lives by herself    Review of Systems - See HPI.  All other ROS are negative.  BP 102/80  Pulse 92  Temp(Src) 98.1 F (36.7 C) (Oral)  Resp 16  Ht 5\' 4"  (1.626 m)  Wt 218 lb 8 oz (99.111 kg)  BMI 37.49 kg/m2  SpO2 97%  LMP 03/04/2012  Physical Exam  Vitals reviewed. Constitutional: She is oriented to person, place, and time and well-developed, well-nourished, and in no distress.  HENT:  Head: Normocephalic and atraumatic.  Right Ear: External ear normal.  Left Ear: External ear normal.  Nose: Nose normal.  Mouth/Throat: Oropharynx is clear and moist. No oropharyngeal exudate.  TM within normal limits bilaterally.  + TTP of sinuses to percussion.  Eyes: Conjunctivae are normal. Pupils are equal, round, and reactive to light.  Neck: Neck supple.  Cardiovascular: Normal rate, regular rhythm, normal heart sounds and intact distal pulses.   Pulmonary/Chest: Effort normal and breath sounds normal. No respiratory distress. She has no wheezes. She has no  rales. She exhibits tenderness.  Lymphadenopathy:    She has no cervical adenopathy.  Neurological: She is alert and oriented to person, place, and time.  Skin: Skin is warm and dry. No rash noted.  Psychiatric: Affect normal.   Assessment/Plan: Sinusitis Rx Azithromycin.  Increase fluids.  Saline nasal spray and Flonase. Humidifier in bedroom.  Delsym for cough.  Costochondritis Secondary to persistent cough.  Tenderness reproducible with palpation of sternum.  Ibuprofen for pain.  Delsym for cough and cough is causing symptoms.

## 2013-07-26 NOTE — Progress Notes (Signed)
Pre visit review using our clinic review tool, if applicable. No additional management support is needed unless otherwise documented below in the visit note/SLS  

## 2013-07-26 NOTE — Assessment & Plan Note (Signed)
Secondary to persistent cough.  Tenderness reproducible with palpation of sternum.  Ibuprofen for pain.  Delsym for cough and cough is causing symptoms.

## 2013-07-26 NOTE — Assessment & Plan Note (Signed)
Rx Azithromycin.  Increase fluids.  Saline nasal spray and Flonase. Humidifier in bedroom.  Delsym for cough.

## 2014-01-13 ENCOUNTER — Other Ambulatory Visit: Payer: Self-pay

## 2014-01-13 DIAGNOSIS — E039 Hypothyroidism, unspecified: Secondary | ICD-10-CM

## 2016-10-14 LAB — HM PAP SMEAR: HM PAP: NEGATIVE

## 2016-10-14 LAB — HM MAMMOGRAPHY

## 2016-11-15 ENCOUNTER — Ambulatory Visit (INDEPENDENT_AMBULATORY_CARE_PROVIDER_SITE_OTHER): Payer: BC Managed Care – PPO | Admitting: Physician Assistant

## 2016-11-15 ENCOUNTER — Encounter: Payer: Self-pay | Admitting: Physician Assistant

## 2016-11-15 VITALS — BP 138/95 | HR 73 | Ht 64.0 in | Wt 211.0 lb

## 2016-11-15 DIAGNOSIS — Z90710 Acquired absence of both cervix and uterus: Secondary | ICD-10-CM

## 2016-11-15 DIAGNOSIS — F331 Major depressive disorder, recurrent, moderate: Secondary | ICD-10-CM

## 2016-11-15 DIAGNOSIS — Z800 Family history of malignant neoplasm of digestive organs: Secondary | ICD-10-CM

## 2016-11-15 DIAGNOSIS — R0789 Other chest pain: Secondary | ICD-10-CM | POA: Insufficient documentation

## 2016-11-15 DIAGNOSIS — G4452 New daily persistent headache (NDPH): Secondary | ICD-10-CM | POA: Diagnosis not present

## 2016-11-15 DIAGNOSIS — I10 Essential (primary) hypertension: Secondary | ICD-10-CM | POA: Diagnosis not present

## 2016-11-15 DIAGNOSIS — Z7689 Persons encountering health services in other specified circumstances: Secondary | ICD-10-CM

## 2016-11-15 MED ORDER — HYDROCHLOROTHIAZIDE 25 MG PO TABS: 25.0000 mg | ORAL_TABLET | Freq: Every day | ORAL | 5 refills | Status: DC

## 2016-11-15 NOTE — Progress Notes (Signed)
HPI:                                                                Teresa Gilbert is a 41 y.o. female who presents to [facility] today to establish care  Current concerns include: headaches, chest discomfort, blood pressure  HTN: has never taken antihypertensive medication. Endorses intermittent headaches and persistent chest pain x 5 days. Reports BP at urgent care was 160/100. Denies vision change, orthopnea, lightheadedness, syncope and edema. Risk factors include: obesity Mother has hypertension. Denies family history of heart disease/stroke.  Chest pain: patient reports 5 days of persistent retrosternal chest discomfort. Reports it is worse with exertion, but present even at rest. Does not radiate. No associated diaphoresis or nausea. Not reproducible. Reports she went to urgent care on Friday and had a normal ECG and chest x-ray.  Headache: patient reports intermittent frontal headache, worse on the right side x 5 days. Pain is throbbing, moderate. Denies vision change, photophobia, nausea, paresthesias, or focal weakness. Has tried OTC pain relievers and Fioricet.    Past Medical History:  Diagnosis Date  . Acute medial meniscus tear of left knee   . Chronic constipation   . Depression   . History of colon polyps    HYPERPLASTIC  . Hyperlipidemia   . Hypertension   . SUI (stress urinary incontinence, female)    Past Surgical History:  Procedure Laterality Date  . BUNIONECTOMY Bilateral 09/2010   REVISION  RIGHT  GREAT TOE  --- AUG 2014  . EXCISION URETHRAL DIVERTICULUM  07-24-2008  . LAPAROSCOPIC BILATERAL SALPINGECTOMY Bilateral 09/03/2012   Procedure: LAPAROSCOPIC BILATERAL SALPINGECTOMY;  Surgeon: Claiborne Billings A. Pamala Hurry, MD;  Location: [facility];  Service: Gynecology;  Laterality: Bilateral;  . PUBOVAGINAL SLING N/A 01/31/2013   Procedure: URETHROPLASTY WITH EXCISION OF OBSTRUCTING MEATAL BAND; LYNX PUBO-VAGINAL SLING;   Surgeon: Ailene Rud, MD;  Location: [facility];  Service: Urology;  Laterality: N/A;  . RIGHT HIP ARTHROSCOPY W/ LABRAL DEBRIDEMENT  02-26-2009  . ROBOTIC ASSISTED TOTAL HYSTERECTOMY N/A 09/03/2012   Procedure: ROBOTIC ASSISTED TOTAL HYSTERECTOMY;  Surgeon: Claiborne Billings A. Pamala Hurry, MD;  Location: [facility];  Service: Gynecology;  Laterality: N/A;   Social History  Substance Use Topics  . Smoking status: Never Smoker  . Smokeless tobacco: Never Used  . Alcohol use No     Comment: rare   family history includes Cancer in her paternal grandmother; Colon cancer in her maternal grandmother and paternal grandmother; Diabetes in her cousin, maternal aunt, mother, and unknown relative; Hyperlipidemia in her mother; Hypertension in her mother; Lung cancer in her unknown relative; Stroke in her paternal grandfather.  ROS: Review of Systems  Cardiovascular: Positive for chest pain. Negative for palpitations, orthopnea, claudication, leg swelling and PND.  Genitourinary:       + nocturia  Musculoskeletal: Positive for joint pain.  Neurological: Positive for headaches.  Psychiatric/Behavioral: Positive for depression. The patient is nervous/anxious and has insomnia.      Medications: Current Outpatient Prescriptions  Medication Sig Dispense Refill  . ACETAMINOPHEN-BUTALBITAL 50-325 MG TABS TK 1 T PO Q 4 H PRF PAIN  0  . hydrochlorothiazide (HYDRODIURIL) 25 MG tablet Take 1 tablet (25 mg total) by mouth daily. Mono  tablet 5   No current facility-administered medications for this visit.    No Known Allergies   Objective:  BP (!) 138/95   Pulse 73   Ht 5\' 4"  (1.626 m)   Wt 211 lb (95.7 kg)   LMP 03/04/2012   BMI 36.22 kg/m  Gen:  alert, not ill-appearing, no distress, appropriate for age 5: head normocephalic without obvious abnormality, conjunctiva and cornea clear, PERRL, EOMs intact, no nystagmus, trachea midline Pulm: Normal work of breathing, normal phonation,  clear to auscultation bilaterally, no wheezes, rales or rhonchi CV: Normal rate, regular rhythm, s1 and s2 distinct, no murmurs, clicks or rubs, no carotid bruit Neuro: alert and oriented x 3, no tremor MSK: wearing cam walker boot on left foot, no peripheral edema Skin: intact, no rashes on exposed skin, no jaundice, no cyanosis Psych: well-groomed, cooperative, good eye contact, slightly flat affect, mood-congruent, speech is articulate, and thought processes clear and goal-directed   No results found for this or any previous visit (from the past 72 hour(s)). No results found.  Depression screen PHQ 2/9 11/15/2016  Decreased Interest 1  Down, Depressed, Hopeless 2  PHQ - 2 Score 3  Altered sleeping 3  Tired, decreased energy 2  Change in appetite 2  Feeling bad or failure about yourself  1  Trouble concentrating 1  Moving slowly or fidgety/restless 1  Suicidal thoughts 0  PHQ-9 Score 13     Assessment and Plan: 41 y.o. female with   1. Encounter to establish care - reviewed PMH, PSH, PFH, medications and allergies - reviewed health maintenance - Pap and Mammogram: August 2017 per patient, requesting records from OB/GYN - reviewed recent outside labs, these will be scanned to the patient's chart  2. New daily persistent headache - plan to control blood pressure with hydrochlorothiazide - no red flag symptoms - cont Fioricet as needed - close follow-up in 2 weeks  3. Chest discomfort - non-reproducible retrosternal chest pain at rest and with exertion. This has been a recurrent problem. Patient reports negative work-up at [facility] urgent care on Friday. Requesting these records. Recommend ischemic work-up with ETT. Patient currently has fractured toes and will need dobutamine for stress - reviewed recent fasting lipid panel which was wnl - cardiac risk factors: HTN, obesity - CBC - Comprehensive metabolic panel - Troponin I - Exercise Tolerance Test;  Future - Exercise Tolerance Test  4. Hypertension goal BP (blood pressure) < 130/80 BP Readings from Last 3 Encounters:  11/15/16 (!) 138/95  07/26/13 102/80  02/01/13 124/71  - hydrochlorothiazide (HYDRODIURIL) 25 MG tablet; Take 1 tablet (25 mg total) by mouth daily.  Dispense: 30 tablet; Refill: 5 - Check blood pressure at home for the next 2 weeks - Check around the same time each day in a relaxed setting - Limit salt to <2000 mg/day - Follow DASH eating plan - limit alcohol to 1 standard drink per day - avoid tobacco products - weight loss: 7% of current body weight  5. Depression - PHQ9 score 13 - no acute safety issues - close follow-up in 2 weeks   Patient education and anticipatory guidance given Patient agrees with treatment plan Follow-up in 2 weeks or sooner as needed if symptoms worsen or fail to improve  Darlyne Russian PA-C

## 2016-11-15 NOTE — Patient Instructions (Addendum)
For your blood pressure: - Start Hydrochlorothiazide daily - Check blood pressure at home for the next 2 weeks - Check around the same time each day in a relaxed setting - Limit salt to <2000 mg/day - Follow DASH eating plan - limit alcohol to 1 standard drink per day - avoid tobacco products - weight loss: 7% of current body weight - Follow-up in 2 weeks   Hypertension Hypertension, commonly called high blood pressure, is when the force of blood pumping through the arteries is too strong. The arteries are the blood vessels that carry blood from the heart throughout the body. Hypertension forces the heart to work harder to pump blood and may cause arteries to become narrow or stiff. Having untreated or uncontrolled hypertension can cause heart attacks, strokes, kidney disease, and other problems. A blood pressure reading consists of a higher number over a lower number. Ideally, your blood pressure should be below 120/80. The first ("top") number is called the systolic pressure. It is a measure of the pressure in your arteries as your heart beats. The second ("bottom") number is called the diastolic pressure. It is a measure of the pressure in your arteries as the heart relaxes. What are the causes? The cause of this condition is not known. What increases the risk? Some risk factors for high blood pressure are under your control. Others are not. Factors you can change  Smoking.  Having type 2 diabetes mellitus, high cholesterol, or both.  Not getting enough exercise or physical activity.  Being overweight.  Having too much fat, sugar, calories, or salt (sodium) in your diet.  Drinking too much alcohol. Factors that are difficult or impossible to change  Having chronic kidney disease.  Having a family history of high blood pressure.  Age. Risk increases with age.  Race. You may be at higher risk if you are African-American.  Gender. Men are at higher risk than women before age  25. After age 37, women are at higher risk than men.  Having obstructive sleep apnea.  Stress. What are the signs or symptoms? Extremely high blood pressure (hypertensive crisis) may cause:  Headache.  Anxiety.  Shortness of breath.  Nosebleed.  Nausea and vomiting.  Severe chest pain.  Jerky movements you cannot control (seizures).  How is this diagnosed? This condition is diagnosed by measuring your blood pressure while you are seated, with your arm resting on a surface. The cuff of the blood pressure monitor will be placed directly against the skin of your upper arm at the level of your heart. It should be measured at least twice using the same arm. Certain conditions can cause a difference in blood pressure between your right and left arms. Certain factors can cause blood pressure readings to be lower or higher than normal (elevated) for a short period of time:  When your blood pressure is higher when you are in a health care provider's office than when you are at home, this is called white coat hypertension. Most people with this condition do not need medicines.  When your blood pressure is higher at home than when you are in a health care provider's office, this is called masked hypertension. Most people with this condition may need medicines to control blood pressure.  If you have a high blood pressure reading during one visit or you have normal blood pressure with other risk factors:  You may be asked to return on a different day to have your blood pressure checked again.  You may be asked to monitor your blood pressure at home for 1 week or longer.  If you are diagnosed with hypertension, you may have other blood or imaging tests to help your health care provider understand your overall risk for other conditions. How is this treated? This condition is treated by making healthy lifestyle changes, such as eating healthy foods, exercising more, and reducing your alcohol  intake. Your health care provider may prescribe medicine if lifestyle changes are not enough to get your blood pressure under control, and if:  Your systolic blood pressure is above 130.  Your diastolic blood pressure is above 80.  Your personal target blood pressure may vary depending on your medical conditions, your age, and other factors. Follow these instructions at home: Eating and drinking  Eat a diet that is high in fiber and potassium, and low in sodium, added sugar, and fat. An example eating plan is called the DASH (Dietary Approaches to Stop Hypertension) diet. To eat this way: ? Eat plenty of fresh fruits and vegetables. Try to fill half of your plate at each meal with fruits and vegetables. ? Eat whole grains, such as whole wheat pasta, brown rice, or whole grain bread. Fill about one quarter of your plate with whole grains. ? Eat or drink low-fat dairy products, such as skim milk or low-fat yogurt. ? Avoid fatty cuts of meat, processed or cured meats, and poultry with skin. Fill about one quarter of your plate with lean proteins, such as fish, chicken without skin, beans, eggs, and tofu. ? Avoid premade and processed foods. These tend to be higher in sodium, added sugar, and fat.  Reduce your daily sodium intake. Most people with hypertension should eat less than 1,500 mg of sodium a day.  Limit alcohol intake to no more than 1 drink a day for nonpregnant women and 2 drinks a day for men. One drink equals 12 oz of beer, 5 oz of wine, or 1 oz of hard liquor. Lifestyle  Work with your health care provider to maintain a healthy body weight or to lose weight. Ask what an ideal weight is for you.  Get at least 30 minutes of exercise that causes your heart to beat faster (aerobic exercise) most days of the week. Activities may include walking, swimming, or biking.  Include exercise to strengthen your muscles (resistance exercise), such as pilates or lifting weights, as part of your  weekly exercise routine. Try to do these types of exercises for 30 minutes at least 3 days a week.  Do not use any products that contain nicotine or tobacco, such as cigarettes and e-cigarettes. If you need help quitting, ask your health care provider.  Monitor your blood pressure at home as told by your health care provider.  Keep all follow-up visits as told by your health care provider. This is important. Medicines  Take over-the-counter and prescription medicines only as told by your health care provider. Follow directions carefully. Blood pressure medicines must be taken as prescribed.  Do not skip doses of blood pressure medicine. Doing this puts you at risk for problems and can make the medicine less effective.  Ask your health care provider about side effects or reactions to medicines that you should watch for. Contact a health care provider if:  You think you are having a reaction to a medicine you are taking.  You have headaches that keep coming back (recurring).  You feel dizzy.  You have swelling in your ankles.  You have trouble with your vision. Get help right away if:  You develop a severe headache or confusion.  You have unusual weakness or numbness.  You feel faint.  You have severe pain in your chest or abdomen.  You vomit repeatedly.  You have trouble breathing. Summary  Hypertension is when the force of blood pumping through your arteries is too strong. If this condition is not controlled, it may put you at risk for serious complications.  Your personal target blood pressure may vary depending on your medical conditions, your age, and other factors. For most people, a normal blood pressure is less than 120/80.  Hypertension is treated with lifestyle changes, medicines, or a combination of both. Lifestyle changes include weight loss, eating a healthy, low-sodium diet, exercising more, and limiting alcohol. This information is not intended to replace  advice given to you by your health care provider. Make sure you discuss any questions you have with your health care provider. Document Released: 02/07/2005 Document Revised: 01/06/2016 Document Reviewed: 01/06/2016 Elsevier Interactive Patient Education  [employer].

## 2016-11-16 LAB — CBC
HCT: 39.4 % (ref 35.0–45.0)
Hemoglobin: 13.5 g/dL (ref 11.7–15.5)
MCH: 30.2 pg (ref 27.0–33.0)
MCHC: 34.3 g/dL (ref 32.0–36.0)
MCV: 88.1 fL (ref 80.0–100.0)
MPV: 9.4 fL (ref 7.5–12.5)
Platelets: 347 10*3/uL (ref 140–400)
RBC: 4.47 10*6/uL (ref 3.80–5.10)
RDW: 13.2 % (ref 11.0–15.0)
WBC: 7.7 10*3/uL (ref 3.8–10.8)

## 2016-11-16 LAB — COMPREHENSIVE METABOLIC PANEL
AG Ratio: 1.6 (calc) (ref 1.0–2.5)
ALT: 12 U/L (ref 6–29)
AST: 17 U/L (ref 10–30)
Albumin: 4.3 g/dL (ref 3.6–5.1)
Alkaline phosphatase (APISO): 51 U/L (ref 33–115)
BUN: 11 mg/dL (ref 7–25)
CO2: 28 mmol/L (ref 20–32)
Calcium: 9.1 mg/dL (ref 8.6–10.2)
Chloride: 103 mmol/L (ref 98–110)
Creat: 0.79 mg/dL (ref 0.50–1.10)
GLOBULIN: 2.7 g/dL (ref 1.9–3.7)
GLUCOSE: 84 mg/dL (ref 65–99)
Potassium: 3.9 mmol/L (ref 3.5–5.3)
Sodium: 138 mmol/L (ref 135–146)
TOTAL PROTEIN: 7 g/dL (ref 6.1–8.1)
Total Bilirubin: 0.6 mg/dL (ref 0.2–1.2)

## 2016-11-16 LAB — TROPONIN I: Troponin I: <0.01 ng/mL (ref <=0.0)

## 2016-11-16 NOTE — Progress Notes (Signed)
Good morning Teresa Gilbert,  Your labs look great. There is no evidence of heart muscle damage. Blood counts and metabolic panel are normal.  It was nice meeting you! See you in 2 weeks.  Best, Evlyn Clines

## 2016-11-24 ENCOUNTER — Encounter: Payer: Self-pay | Admitting: Physician Assistant

## 2016-11-24 ENCOUNTER — Ambulatory Visit: Payer: BC Managed Care – PPO

## 2016-11-24 ENCOUNTER — Other Ambulatory Visit: Payer: Self-pay | Admitting: Physician Assistant

## 2016-11-24 ENCOUNTER — Telehealth: Payer: Self-pay | Admitting: Radiology

## 2016-11-24 DIAGNOSIS — R0789 Other chest pain: Secondary | ICD-10-CM

## 2016-11-24 NOTE — Telephone Encounter (Signed)
Left message advising patient of recommendations.  °

## 2016-11-24 NOTE — Telephone Encounter (Signed)
Patient came in for a ETT today it was cancelled. Unfortunately she has broken toes and cant walk the treadmill at this time. She was told she was getting a "dye" stess test. Upon further investigation the ETT was ordered with dobutamine. We don't do dobutamine ETTs at our office. I wondering if the doctor wanted a nuclear test with lexiscan. Since the patient cant walk at this time.

## 2016-11-24 NOTE — Telephone Encounter (Signed)
I will refer the patient to Cardiology and let them decide what is the most appropriate stress test for this patient

## 2016-11-29 ENCOUNTER — Encounter: Payer: Self-pay | Admitting: Physician Assistant

## 2016-11-29 ENCOUNTER — Ambulatory Visit: Payer: BC Managed Care – PPO | Admitting: Physician Assistant

## 2016-11-29 ENCOUNTER — Ambulatory Visit (INDEPENDENT_AMBULATORY_CARE_PROVIDER_SITE_OTHER): Payer: BC Managed Care – PPO | Admitting: Physician Assistant

## 2016-11-29 VITALS — BP 108/75 | HR 80 | Wt 212.0 lb

## 2016-11-29 DIAGNOSIS — Z6836 Body mass index (BMI) 36.0-36.9, adult: Secondary | ICD-10-CM

## 2016-11-29 DIAGNOSIS — Z136 Encounter for screening for cardiovascular disorders: Secondary | ICD-10-CM | POA: Diagnosis not present

## 2016-11-29 DIAGNOSIS — Z13220 Encounter for screening for lipoid disorders: Secondary | ICD-10-CM | POA: Diagnosis not present

## 2016-11-29 DIAGNOSIS — R079 Chest pain, unspecified: Secondary | ICD-10-CM | POA: Diagnosis not present

## 2016-11-29 DIAGNOSIS — R0609 Other forms of dyspnea: Secondary | ICD-10-CM

## 2016-11-29 DIAGNOSIS — I10 Essential (primary) hypertension: Secondary | ICD-10-CM

## 2016-11-29 DIAGNOSIS — R0600 Dyspnea, unspecified: Secondary | ICD-10-CM

## 2016-11-29 LAB — LIPID PANEL W/REFLEX DIRECT LDL
CHOLESTEROL: 202 mg/dL — AB (ref <200)
HDL: 60 mg/dL (ref >50)
LDL CHOLESTEROL (CALC): 113 mg/dL — AB
Non-HDL Cholesterol (Calc): 142 mg/dL (calc) — ABNORMAL HIGH (ref <130)
Total CHOL/HDL Ratio: 3.4 (calc) (ref <5.0)
Triglycerides: 170 mg/dL — ABNORMAL HIGH (ref <150)

## 2016-11-29 LAB — D-DIMER, QUANTITATIVE (NOT AT ARMC): D-Dimer, Quant: 0.19 mcg/mL FEU (ref <0.50)

## 2016-11-29 NOTE — Patient Instructions (Addendum)
- Labs today - Follow-up with [facility] [phone]  For your blood pressure: - Start baby aspirin 81 mg to help prevent heart attack/stroke - Limit salt to <2000 mg/day - Follow DASH eating plan - limit alcohol to 1 standard drinks per day - avoid tobacco products - weight loss: 7% of current body weight - Follow-up in 3 months   Nonspecific Chest Pain Chest pain can be caused by many different conditions. There is always a chance that your pain could be related to something serious, such as a heart attack or a blood clot in your lungs. Chest pain can also be caused by conditions that are not life-threatening. If you have chest pain, it is very important to follow up with your health care provider. What are the causes? Causes of this condition include:  Heartburn.  Pneumonia or bronchitis.  Anxiety or stress.  Inflammation around your heart (pericarditis) or lung (pleuritis or pleurisy).  A blood clot in your lung.  A collapsed lung (pneumothorax). This can develop suddenly on its own (spontaneous pneumothorax) or from trauma to the chest.  Shingles infection (varicella-zoster virus).  Heart attack.  Damage to the bones, muscles, and cartilage that make up your chest wall. This can include: ? Bruised bones due to injury. ? Strained muscles or cartilage due to frequent or repeated coughing or overwork. ? Fracture to one or more ribs. ? Sore cartilage due to inflammation (costochondritis).  What increases the risk? Risk factors for this condition may include:  Activities that increase your risk for trauma or injury to your chest.  Respiratory infections or conditions that cause frequent coughing.  Medical conditions or overeating that can cause heartburn.  Heart disease or family history of heart disease.  Conditions or health behaviors that increase your risk of developing a blood clot.  Having had chicken pox (varicella zoster).  What are the signs or  symptoms? Chest pain can feel like:  Burning or tingling on the surface of your chest or deep in your chest.  Crushing, pressure, aching, or squeezing pain.  Dull or sharp pain that is worse when you move, cough, or take a deep breath.  Pain that is also felt in your back, neck, shoulder, or arm, or pain that spreads to any of these areas.  Your chest pain may come and go, or it may stay constant. How is this diagnosed? Lab tests or other studies may be needed to find the cause of your pain. Your health care provider may have you take a test called an ECG (electrocardiogram). An ECG records your heartbeat patterns at the time the test is performed. You may also have other tests, such as:  Transthoracic echocardiogram (TTE). In this test, sound waves are used to create a picture of the heart structures and to look at how blood flows through your heart.  Transesophageal echocardiogram (TEE).This is a more advanced imaging test that takes images from inside your body. It allows your health care provider to see your heart in finer detail.  Cardiac monitoring. This allows your health care provider to monitor your heart rate and rhythm in real time.  Holter monitor. This is a portable device that records your heartbeat and can help to diagnose abnormal heartbeats. It allows your health care provider to track your heart activity for several days, if needed.  Stress tests. These can be done through exercise or by taking medicine that makes your heart beat more quickly.  Blood tests.  Other imaging  tests.  How is this treated? Treatment depends on what is causing your chest pain. Treatment may include:  Medicines. These may include: ? Acid blockers for heartburn. ? Anti-inflammatory medicine. ? Pain medicine for inflammatory conditions. ? Antibiotic medicine, if an infection is present. ? Medicines to dissolve blood clots. ? Medicines to treat coronary artery disease  (CAD).  Supportive care for conditions that do not require medicines. This may include: ? Resting. ? Applying heat or cold packs to injured areas. ? Limiting activities until pain decreases.  Follow these instructions at home: Medicines  If you were prescribed an antibiotic, take it as told by your health care provider. Do not stop taking the antibiotic even if you start to feel better.  Take over-the-counter and prescription medicines only as told by your health care provider. Lifestyle  Do not use any products that contain nicotine or tobacco, such as cigarettes and e-cigarettes. If you need help quitting, ask your health care provider.  Do not drink alcohol.  Make lifestyle changes as directed by your health care provider. These may include: ? Getting regular exercise. Ask your health care provider to suggest some activities that are safe for you. ? Eating a heart-healthy diet. A registered dietitian can help you to learn healthy eating options. ? Maintaining a healthy weight. ? Managing diabetes, if necessary. ? Reducing stress, such as with yoga or relaxation techniques. General instructions  Avoid any activities that bring on chest pain.  If heartburn is the cause for your chest pain, raise (elevate) the head of your bed about 6 inches (15 cm) by putting blocks under the legs. Sleeping with more pillows does not effectively relieve heartburn because it only changes the position of your head.  Keep all follow-up visits as told by your health care provider. This is important. This includes any further testing if your chest pain does not go away. Contact a health care provider if:  Your chest pain does not go away.  You have a rash with blisters on your chest.  You have a fever.  You have chills. Get help right away if:  Your chest pain is worse.  You have a cough that gets worse, or you cough up blood.  You have severe pain in your abdomen.  You have severe  weakness.  You faint.  You have sudden, unexplained chest discomfort.  You have sudden, unexplained discomfort in your arms, back, neck, or jaw.  You have shortness of breath at any time.  You suddenly start to sweat, or your skin gets clammy.  You feel nauseous or you vomit.  You suddenly feel light-headed or dizzy.  Your heart begins to beat quickly, or it feels like it is skipping beats. These symptoms may represent a serious problem that is an emergency. Do not wait to see if the symptoms will go away. Get medical help right away. Call your local emergency services (911 in the [place] Do not drive yourself to the hospital. This information is not intended to replace advice given to you by your health care provider. Make sure you discuss any questions you have with your health care provider. Document Released: 11/17/2004 Document Revised: 11/02/2015 Document Reviewed: 11/02/2015 Elsevier Interactive Patient Education  2017 [employer].

## 2016-11-29 NOTE — Progress Notes (Signed)
HPI:                                                                Teresa Gilbert is a 41 y.o. female who presents to [facility] [place] today for hypertension follow-up  HTN: taking Hydrochlorothiazide daily. Compliant with medications. Checks BP's at home. BP range 113-118/83-85 on medication. Prior to starting medication, home BP range 122-166/87-101. Risk factors include: family history, obesity  Chest pain: onset approx. 2 weeks ago. Pain is constant, retrosternal, described as dull and mild. Worsened by exertion and certain positional changes. Not reproducible. No alleviating factors. Associated with DOE. Denies associated nausea, diaphoresis, palpitations. She does report intermittent acid reflux, but states this pain is different. She reports a normal CXR and normal ECG at [facility] 2 weeks ago. Troponin negative on 11/15/16.  Past Medical History:  Diagnosis Date  . Acute medial meniscus tear of left knee   . Chronic constipation   . Depression   . History of colon polyps    HYPERPLASTIC  . Hypertension   . SUI (stress urinary incontinence, female)    Past Surgical History:  Procedure Laterality Date  . BUNIONECTOMY Bilateral 09/2010   REVISION  RIGHT  GREAT TOE  --- AUG 2014  . EXCISION URETHRAL DIVERTICULUM  07-24-2008  . LAPAROSCOPIC BILATERAL SALPINGECTOMY Bilateral 09/03/2012   Procedure: LAPAROSCOPIC BILATERAL SALPINGECTOMY;  Surgeon: Claiborne Billings A. Pamala Hurry, MD;  Location: [facility];  Service: Gynecology;  Laterality: Bilateral;  . PUBOVAGINAL SLING N/A 01/31/2013   Procedure: URETHROPLASTY WITH EXCISION OF OBSTRUCTING MEATAL BAND; LYNX PUBO-VAGINAL SLING;  Surgeon: Ailene Rud, MD;  Location: [facility];  Service: Urology;  Laterality: N/A;  . RIGHT HIP ARTHROSCOPY W/ LABRAL DEBRIDEMENT  02-26-2009  . ROBOTIC ASSISTED TOTAL HYSTERECTOMY N/A 09/03/2012   Procedure: ROBOTIC ASSISTED TOTAL  HYSTERECTOMY;  Surgeon: Claiborne Billings A. Pamala Hurry, MD;  Location: [facility];  Service: Gynecology;  Laterality: N/A;   Social History  Substance Use Topics  . Smoking status: Never Smoker  . Smokeless tobacco: Never Used  . Alcohol use No     Comment: rare   family history includes Cancer in her paternal grandmother; Colon cancer in her maternal grandmother and paternal grandmother; Diabetes in her cousin, maternal aunt, mother, and unknown relative; Hyperlipidemia in her maternal aunt and mother; Hypertension in her maternal aunt and mother; Lung cancer in her unknown relative; Stroke in her paternal grandfather.  ROS: negative except as noted in the HPI  Medications: Current Outpatient Prescriptions  Medication Sig Dispense Refill  . ACETAMINOPHEN-BUTALBITAL 50-325 MG TABS TK 1 T PO Q 4 H PRF PAIN  0  . Ascorbic Acid (VITA-C PO) Take by mouth.    . Cholecalciferol (VITAMIN D PO) Take by mouth.    . Coenzyme Q10 (COQ-10 PO) Take by mouth.    . hydrochlorothiazide (HYDRODIURIL) 25 MG tablet Take 1 tablet (25 mg total) by mouth daily. 30 tablet 5  . Multiple Vitamins-Minerals (MULTIVITAL PO) Take by mouth.     No current facility-administered medications for this visit.    No Known Allergies     Objective:  BP 108/75   Pulse 80   Wt 212 lb (96.2 kg)   LMP 03/04/2012   BMI  36.39 kg/m  Gen:  alert, not ill-appearing, no distress, appropriate for age, obese female HEENT: head normocephalic without obvious abnormality, conjunctiva and cornea clear, trachea midline Pulm: Normal work of breathing, normal phonation, clear to auscultation bilaterally, no wheezes, rales or rhonchi CV: Normal rate, regular rhythm, s1 and s2 distinct, no murmurs, clicks or rubs, radial pulses 2+ symmetric Neuro: alert and oriented x 3, no tremor MSK: extremities atraumatic, normal gait and station Skin: intact, no rashes on exposed skin, no jaundice, no cyanosis    No results found for this or any previous  visit (from the past 72 hour(s)). No results found.  ECG 11/29/2016 Vent Rate 74 bpm PR-I 170 ms QRS 64 ms QT/QTc 374/415 Normal sinus rhythm  Assessment and Plan: 41 y.o. female with   1. Chest pain, unspecified type - ECG NSR, normal axis without dysrhythmia, heart block, or ST or T wave abnormalities - Well's criteria 1.5 - recent foot trauma/immobilization. Cannot rule out PE. Ordering stat D-dimer - recommend ischemic work-up through cardiology. Referral was placed and patient reports she has not been contacted for an appointment yet. She was not a candidate for exercise stress due to toe fractures. - D-dimer, quantitative (not at [facility])  2. DOE (dyspnea on exertion) - D-dimer, quantitative (not at [facility])  3. Hypertension goal BP (blood pressure) < 130/80 BP Readings from Last 3 Encounters:  11/29/16 108/75  11/15/16 (!) 138/95  07/26/13 102/80  - BP in range today and on recent home readings - cont HCTZ at current dose - therapeutic lifestyle changes - follow-up in 3 months   4. Encounter for lipid screening for cardiovascular disease - Lipid Panel w/reflex Direct LDL  5. Class 2 severe obesity due to excess calories with serious comorbidity and body mass index (BMI) of 36.0 to 36.9 in adult (Index) Wt Readings from Last 3 Encounters:  11/29/16 212 lb (96.2 kg)  11/15/16 211 lb (95.7 kg)  07/26/13 218 lb 8 oz (99.1 kg)  - therapeutic lifestyle changes  Patient education and anticipatory guidance given Patient agrees with treatment plan Follow-up in 3 months or sooner as needed if symptoms worsen or fail to improve  Darlyne Russian PA-C

## 2016-11-30 NOTE — Progress Notes (Signed)
Good morning Teresa Gilbert,  Good news: your d-dimer was negative, which rules out a blood clot.  Your cholesterol is in a healthy range. Continue to work on following a heart healthy diet and regular cardiovascular exercise.  If you have not heard from cardiology, be sure to call the number on your AVS and schedule an appointment.  Best, Evlyn Clines

## 2016-12-01 ENCOUNTER — Encounter: Payer: Self-pay | Admitting: Physician Assistant

## 2016-12-02 ENCOUNTER — Encounter: Payer: Self-pay | Admitting: Physician Assistant

## 2016-12-06 ENCOUNTER — Telehealth (HOSPITAL_COMMUNITY): Payer: Self-pay | Admitting: *Deleted

## 2016-12-06 ENCOUNTER — Encounter: Payer: Self-pay | Admitting: Cardiology

## 2016-12-06 ENCOUNTER — Ambulatory Visit (INDEPENDENT_AMBULATORY_CARE_PROVIDER_SITE_OTHER): Payer: BC Managed Care – PPO | Admitting: Cardiology

## 2016-12-06 VITALS — BP 120/78 | HR 95 | Ht 64.0 in | Wt 213.4 lb

## 2016-12-06 DIAGNOSIS — I10 Essential (primary) hypertension: Secondary | ICD-10-CM | POA: Insufficient documentation

## 2016-12-06 DIAGNOSIS — R0789 Other chest pain: Secondary | ICD-10-CM | POA: Diagnosis not present

## 2016-12-06 NOTE — Progress Notes (Signed)
Cardiology Office Note:    Date:  12/06/2016   ID:  Teresa Gilbert, DOB March 06, 1975, MRN [id number]  PCP:  Trixie Dredge, PA-C  Cardiologist:  Jenean Lindau, MD   Referring MD: Ottis Stain*    ASSESSMENT:    1. Chest discomfort    PLAN:    In order of problems listed above:  1. Her chest pain symptoms are atypical. I reassured her about this. However in view of multiple risk factors for coronary artery disease I will set her up for a Lexiscan sestamibi. I reviewed records from her previous doctor visits and discussed this with her at length. EKG was also reviewed. 2. In the interim if she has any significant symptoms he knows to go to the nearest emergency room. 3. Her blood pressure stable. 4. Patient will be seen in follow-up appointment in 4 months or earlier if the patient has any concerns.    Medication Adjustments/Labs and Tests Ordered: Current medicines are reviewed at length with the patient today.  Concerns regarding medicines are outlined above.  Orders Placed This Encounter  Procedures  . MYOCARDIAL PERFUSION IMAGING   No orders of the defined types were placed in this encounter.    History of Present Illness:    Teresa Gilbert is a 41 y.o. female who is being seen today for the evaluation of chest pain and essential hypertension at the request of Ottis Stain*. Patient is a pleasant 41 year old female. She has past medical history of essential hypertension. She is an active lady. For the past several weeks she is leading a sedentary lifestyle because she has a fracture of her toes. No orthopnea or PND. At the time of my evaluation she is alert awake oriented and in no distress. She complains of some chest discomfort at times and this is not related to exertion. Since she leads a sedentary lifestyle I asked if she was sexually active and she mentioned to me that she. Sexual activity also gives [place] summary chest  discomfort  Past Medical History:  Diagnosis Date  . Acute medial meniscus tear of left knee   . Chronic constipation   . Depression   . History of colon polyps    HYPERPLASTIC  . Hypertension   . SUI (stress urinary incontinence, female)     Past Surgical History:  Procedure Laterality Date  . BUNIONECTOMY Bilateral 09/2010   REVISION  RIGHT  GREAT TOE  --- AUG 2014  . EXCISION URETHRAL DIVERTICULUM  07-24-2008  . LAPAROSCOPIC BILATERAL SALPINGECTOMY Bilateral 09/03/2012   Procedure: LAPAROSCOPIC BILATERAL SALPINGECTOMY;  Surgeon: Claiborne Billings A. Pamala Hurry, MD;  Location: [facility];  Service: Gynecology;  Laterality: Bilateral;  . PUBOVAGINAL SLING N/A 01/31/2013   Procedure: URETHROPLASTY WITH EXCISION OF OBSTRUCTING MEATAL BAND; LYNX PUBO-VAGINAL SLING;  Surgeon: Ailene Rud, MD;  Location: [facility];  Service: Urology;  Laterality: N/A;  . RIGHT HIP ARTHROSCOPY W/ LABRAL DEBRIDEMENT  02-26-2009  . ROBOTIC ASSISTED TOTAL HYSTERECTOMY N/A 09/03/2012   Procedure: ROBOTIC ASSISTED TOTAL HYSTERECTOMY;  Surgeon: Claiborne Billings A. Pamala Hurry, MD;  Location: [facility];  Service: Gynecology;  Laterality: N/A;    Current Medications: Current Meds  Medication Sig  . Ascorbic Acid (VITA-C PO) Take 1 capsule by mouth daily.   Marland Kitchen aspirin 81 MG chewable tablet Chew 81 mg by mouth daily.  . Cholecalciferol (VITAMIN D PO) Take 1 capsule by mouth daily.   . Coenzyme Q10 (COQ-10 PO) Take by mouth.  . hydrochlorothiazide (HYDRODIURIL)  25 MG tablet Take 1 tablet (25 mg total) by mouth daily.  . Hylan (SYNVISC ONE IX) Inject into the articular space.  . Magnesium 500 MG CAPS Take 500 mg by mouth daily.  . Multiple Vitamins-Minerals (MULTIVITAL PO) Take by mouth.  . Probiotic Product (PROBIOTIC-10 PO) Take 1 capsule by mouth daily.     Allergies:   Patient has no known allergies.   Social History   Social History  . Marital status: Single    Spouse name: N/A  . Number of children: 0  .  Years of education: N/A   Occupational History  . special ed teacher    Social History Main Topics  . Smoking status: Never Smoker  . Smokeless tobacco: Never Used  . Alcohol use No     Comment: rare  . Drug use: No  . Sexual activity: Yes    Birth control/ protection: Condom   Other Topics Concern  . None   Social History Narrative   Lives by herself     Family History: The patient's family history includes Cancer in her paternal grandmother; Colon cancer in her maternal grandmother and paternal grandmother; Diabetes in her cousin, maternal aunt, mother, and unknown relative; Hyperlipidemia in her maternal aunt and mother; Hypertension in her maternal aunt and mother; Lung cancer in her unknown relative; Stroke in her paternal grandfather.  ROS:   Please see the history of present illness.    All other systems reviewed and are negative.  EKGs/Labs/Other Studies Reviewed:    The following studies were reviewed today: I reviewed records from primary care physician extensively and discussed the findings with the patient.   Recent Labs: 11/15/2016: ALT 12; BUN 11; Creat 0.79; Hemoglobin 13.5; Platelets 347; Potassium 3.9; Sodium 138  Recent Lipid Panel    Component Value Date/Time   CHOL 202 (H) 11/29/2016 1543   TRIG 170 (H) 11/29/2016 1543   HDL 60 11/29/2016 1543   CHOLHDL 3.4 11/29/2016 1543   VLDL 13.8 12/15/2010 1150   LDLCALC 102 (H) 12/15/2010 1150    Physical Exam:    VS:  BP 120/78   Pulse 95   Ht 5\' 4"  (1.626 m)   Wt 213 lb 6.4 oz (96.8 kg)   LMP 03/04/2012   SpO2 97%   BMI 36.63 kg/m     Wt Readings from Last 3 Encounters:  12/06/16 213 lb 6.4 oz (96.8 kg)  11/29/16 212 lb (96.2 kg)  11/15/16 211 lb (95.7 kg)     GEN: Patient is in no acute distress HEENT: Normal NECK: No JVD; No carotid bruits LYMPHATICS: No lymphadenopathy CARDIAC: S1 S2 regular, 2/6 systolic murmur at the apex. RESPIRATORY:  Clear to auscultation without rales, wheezing  or rhonchi  ABDOMEN: Soft, non-tender, non-distended MUSCULOSKELETAL:  No edema; No deformity  SKIN: Warm and dry NEUROLOGIC:  Alert and oriented x 3 PSYCHIATRIC:  Normal affect    Signed, Jenean Lindau, MD  12/06/2016 10:22 AM    [facility]

## 2016-12-06 NOTE — Patient Instructions (Signed)
Medication Instructions:  Your physician recommends that you continue on your current medications as directed. Please refer to the Current Medication list given to you today.   Labwork: None  Testing/Procedures: Your physician has requested that you have a lexiscan myoview. For further information please visit [email or web address]. Please follow instruction sheet, as given.    Follow-Up: 4 months  Any Other Special Instructions Will Be Listed Below (If Applicable).     If you need a refill on your cardiac medications before your next appointment, please call your pharmacy.  Regadenoson injection What is this medicine? REGADENOSON is used to test the heart for coronary artery disease. It is used in patients who can not exercise for their stress test. This medicine may be used for other purposes; ask your health care provider or pharmacist if you have questions. COMMON BRAND NAME(S): Lexiscan What should I tell my health care provider before I take this medicine? They need to know if you have any of these conditions: -heart problems -lung or breathing disease, like asthma or COPD -an unusual or allergic reaction to regadenoson, other medicines, foods, dyes, or preservatives -pregnant or trying to get pregnant -breast-feeding How should I use this medicine? This medicine is for injection into a vein. It is given by a health care professional in a hospital or clinic setting. Talk to your pediatrician regarding the use of this medicine in children. Special care may be needed. Overdosage: If you think you have taken too much of this medicine contact a poison control center or emergency room at once. NOTE: This medicine is only for you. Do not share this medicine with others. What if I miss a dose? This does not apply. What may interact with this medicine? -caffeine -dipyridamole -guarana -theophylline This list may not describe all possible interactions. Give your health care  provider a list of all the medicines, herbs, non-prescription drugs, or dietary supplements you use. Also tell them if you smoke, drink alcohol, or use illegal drugs. Some items may interact with your medicine. What should I watch for while using this medicine? Your condition will be monitored carefully while you are receiving this medicine. Do not take medicines, foods, or drinks with caffeine (like coffee, tea, or colas) for at least 12 hours before your test. If you do not know if something contains caffeine, ask your health care professional. What side effects may I notice from receiving this medicine? Side effects that you should report to your doctor or health care professional as soon as possible: -allergic reactions like skin rash, itching or hives, swelling of the face, lips, or tongue -breathing problems -chest pain, tightness or palpitations -severe headache Side effects that usually do not require medical attention (report to your doctor or health care professional if they continue or are bothersome): -flushing -headache -irritation or pain at site where injected -nausea, vomiting This list may not describe all possible side effects. Call your doctor for medical advice about side effects. You may report side effects to FDA at 1-800-FDA-1088. Where should I keep my medicine? This drug is given in a hospital or clinic and will not be stored at home. NOTE: This sheet is a summary. It may not cover all possible information. If you have questions about this medicine, talk to your doctor, pharmacist, or health care provider.  2018 Elsevier/Gold Standard (2007-10-08 15:08:13)

## 2016-12-06 NOTE — Telephone Encounter (Signed)
Left message on voicemail per DPR in reference to upcoming appointment scheduled on 12/09/16 at 7:45 with detailed instructions given per Myocardial Perfusion Study Information Sheet for the test. LM to arrive 15 minutes early, and that it is imperative to arrive on time for appointment to keep from having the test rescheduled. If you need to cancel or reschedule your appointment, please call the office within 24 hours of your appointment. Failure to do so may result in a cancellation of your appointment, and a $50 no show fee. Phone number given for call back for any questions. Veronia Beets

## 2016-12-09 ENCOUNTER — Ambulatory Visit (HOSPITAL_COMMUNITY): Payer: BC Managed Care – PPO | Attending: Cardiovascular Disease

## 2016-12-09 DIAGNOSIS — R079 Chest pain, unspecified: Secondary | ICD-10-CM | POA: Insufficient documentation

## 2016-12-09 DIAGNOSIS — R0789 Other chest pain: Secondary | ICD-10-CM | POA: Diagnosis not present

## 2016-12-09 DIAGNOSIS — I2510 Atherosclerotic heart disease of native coronary artery without angina pectoris: Secondary | ICD-10-CM | POA: Insufficient documentation

## 2016-12-09 DIAGNOSIS — I10 Essential (primary) hypertension: Secondary | ICD-10-CM | POA: Diagnosis not present

## 2016-12-09 LAB — MYOCARDIAL PERFUSION IMAGING
CHL CUP NUCLEAR SSS: 5
LHR: 0.25
LV sys vol: 31 mL
LVDIAVOL: 94 mL (ref 46–106)
Peak HR: 120 {beats}/min
Rest HR: 68 {beats}/min
SDS: 0
SRS: 5
TID: 1.1

## 2016-12-09 MED ORDER — TECHNETIUM TC 99M TETROFOSMIN IV KIT
10.2000 | PACK | Freq: Once | INTRAVENOUS | Status: AC | PRN
  Administered 2016-12-09: 10.2 via INTRAVENOUS
  Filled 2016-12-09: qty 11

## 2016-12-09 MED ORDER — TECHNETIUM TC 99M TETROFOSMIN IV KIT
31.7000 | PACK | Freq: Once | INTRAVENOUS | Status: AC | PRN
  Administered 2016-12-09: 31.7 via INTRAVENOUS
  Filled 2016-12-09: qty 32

## 2016-12-09 MED ORDER — REGADENOSON 0.4 MG/5ML IV SOLN
0.4000 mg | Freq: Once | INTRAVENOUS | Status: AC
  Administered 2016-12-09: 0.4 mg via INTRAVENOUS

## 2017-03-03 ENCOUNTER — Encounter: Payer: Self-pay | Admitting: Physician Assistant

## 2019-03-01 IMAGING — NM NM MISC PROCEDURE
6 series · 36 of 36 positions shown · non-contrast
Comparison: none

[Series 1: rest · 6.51mm/px · 6 of 64 frames shown]
[frame 6/64]
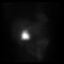
[frame 16/64]
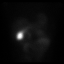
[frame 27/64]
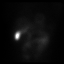
[frame 38/64]
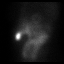
[frame 48/64]
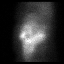
[frame 59/64]
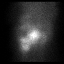

[Series 1: wbr_r-proj_st rest · 6.51mm/px · 6 of 64 frames shown]
[frame 6/64]
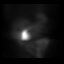
[frame 16/64]
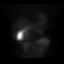
[frame 27/64]
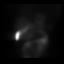
[frame 38/64]
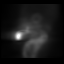
[frame 48/64]
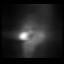
[frame 59/64]
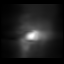

[Series 2: wbr_s-proj_st stress · 6.51mm/px · 6 of 64 frames shown (1 of 2)]
[frame 6/64]
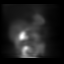
[frame 16/64]
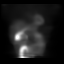
[frame 27/64]
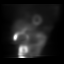
[frame 38/64]
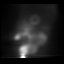
[frame 48/64]
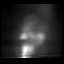
[frame 59/64]
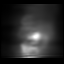

[Series 2: wbr_s-proj_st stress · 6.51mm/px · 6 of 512 frames shown (2 of 2)]
[frame 43/512]
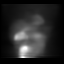
[frame 128/512]
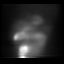
[frame 214/512]
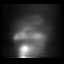
[frame 299/512]
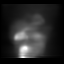
[frame 384/512]
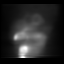
[frame 470/512]
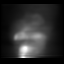

[Series 2: stress · 6.51mm/px · 6 of 64 frames shown (1 of 2)]
[frame 6/64]
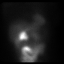
[frame 16/64]
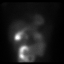
[frame 27/64]
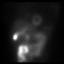
[frame 38/64]
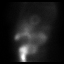
[frame 48/64]
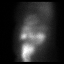
[frame 59/64]
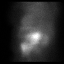

[Series 2: stress · 6.51mm/px · 6 of 512 frames shown (2 of 2)]
[frame 43/512]
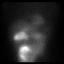
[frame 128/512]
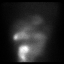
[frame 214/512]
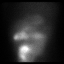
[frame 299/512]
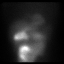
[frame 384/512]
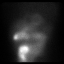
[frame 470/512]
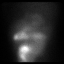

[36 of 36 positions shown; findings below may reference images not displayed]

Canned report from images found in remote index.

Refer to host system for actual result text.

## 2019-05-23 ENCOUNTER — Emergency Department (INDEPENDENT_AMBULATORY_CARE_PROVIDER_SITE_OTHER): Payer: BC Managed Care – PPO

## 2019-05-23 ENCOUNTER — Emergency Department
Admission: EM | Admit: 2019-05-23 | Discharge: 2019-05-23 | Disposition: A | Discharge status: Home / Self Care | Payer: BC Managed Care – PPO | Source: Home / Self Care | Attending: Family Medicine | Admitting: Family Medicine

## 2019-05-23 ENCOUNTER — Other Ambulatory Visit: Payer: Self-pay

## 2019-05-23 DIAGNOSIS — R079 Chest pain, unspecified: Secondary | ICD-10-CM

## 2019-05-23 DIAGNOSIS — M940 Chondrocostal junction syndrome [Tietze]: Secondary | ICD-10-CM | POA: Diagnosis not present

## 2019-05-23 MED ORDER — PREDNISONE 20 MG PO TABS: ORAL_TABLET | ORAL | 0 refills | Status: DC

## 2019-05-23 NOTE — ED Provider Notes (Signed)
[facility]    CSN: [id number] Arrival date & time: 05/23/19  1436      History   Chief Complaint Chief Complaint  Patient presents with  . Chest Pain    HPI Teresa Gilbert is a 44 y.o. female.   Patient developed persistent non-radiating anterior pressure-like chest pain centered over her sternum after going on a vigorous walk two days ago.  She denies shortness of breath, but has pain with deep inspiration and chest movement.  She recalls no injury or change in activity other than her recent walk.  She denies leg pain or swelling. Patient admits that she has not taken her HCTZ for her hypertension recently.  The history is provided by the patient.  Chest Pain Pain location:  Substernal area Pain quality: pressure and tightness   Pain radiates to:  Does not radiate Pain severity:  Moderate Onset quality:  Sudden Duration:  2 days Timing:  Constant Progression:  Unchanged Chronicity:  New Context: breathing, lifting, movement and at rest   Context: not trauma   Relieved by:  None tried Worsened by:  Deep breathing, movement and certain positions Ineffective treatments:  None tried Associated symptoms: no abdominal pain, no AICD problem, no back pain, no claudication, no cough, no diaphoresis, no dizziness, no dysphagia, no fatigue, no fever, no heartburn, no lower extremity edema, no nausea, no palpitations, no PND, no shortness of breath and no syncope   Risk factors: hypertension and obesity   Risk factors: no prior DVT/PE     Past Medical History:  Diagnosis Date  . Acute medial meniscus tear of left knee   . Chronic constipation   . Depression   . History of colon polyps    HYPERPLASTIC  . Hypertension   . SUI (stress urinary incontinence, female)     Patient Active Problem List   Diagnosis Date Noted  . Essential hypertension 12/06/2016  . DOE (dyspnea on exertion) 11/29/2016  . Class 2 severe obesity due to excess calories with serious  comorbidity and body mass index (BMI) of 36.0 to 36.9 in adult ([place]) 11/29/2016  . New daily persistent headache 11/15/2016  . Chest discomfort 11/15/2016  . Hypertension goal BP (blood pressure) < 130/80 11/15/2016  . Family history of colon cancer 11/15/2016  . History of hysterectomy for indication other than cancer 11/15/2016  . Moderate episode of recurrent major depressive disorder ([place]) 11/15/2016  . Sinusitis 07/26/2013  . Costochondritis 07/26/2013  . Incontinence of urine in female 01/31/2013  . Preop examination 08/10/2012  . Thyroid disease ? 08/10/2012  . Chronic back pain 11/15/2011  . Easy bruising 09/15/2011  . General medical examination 12/15/2010  . OVERWEIGHT 04/30/2010  . Constipation 10/19/2009  . INSOMNIA-SLEEP DISORDER-UNSPEC 05/26/2008  . Anxiety and depression 06/22/2007  . GERD 06/22/2007    Past Surgical History:  Procedure Laterality Date  . BUNIONECTOMY Bilateral 09/2010   REVISION  RIGHT  GREAT TOE  --- AUG 2014  . EXCISION URETHRAL DIVERTICULUM  07-24-2008  . LAPAROSCOPIC BILATERAL SALPINGECTOMY Bilateral 09/03/2012   Procedure: LAPAROSCOPIC BILATERAL SALPINGECTOMY;  Surgeon: Claiborne Billings A. Pamala Hurry, MD;  Location: [facility];  Service: Gynecology;  Laterality: Bilateral;  . PUBOVAGINAL SLING N/A 01/31/2013   Procedure: URETHROPLASTY WITH EXCISION OF OBSTRUCTING MEATAL BAND; LYNX PUBO-VAGINAL SLING;  Surgeon: Ailene Rud, MD;  Location: [facility];  Service: Urology;  Laterality: N/A;  . RIGHT HIP ARTHROSCOPY W/ LABRAL DEBRIDEMENT  02-26-2009  . ROBOTIC ASSISTED TOTAL HYSTERECTOMY N/A 09/03/2012  Procedure: ROBOTIC ASSISTED TOTAL HYSTERECTOMY;  Surgeon: Floyce Stakes. Pamala Hurry, MD;  Location: [facility];  Service: Gynecology;  Laterality: N/A;    OB History   No obstetric history on file.      Home Medications    Prior to Admission medications   Medication Sig Start Date End Date Taking? Authorizing Provider  hydrochlorothiazide  (HYDRODIURIL) 25 MG tablet Take 1 tablet (25 mg total) by mouth daily. 11/15/16  Yes Trixie Dredge, PA-C  ACETAMINOPHEN-BUTALBITAL 50-325 MG TABS TK 1 T PO Q 4 H PRF PAIN 11/11/16   [provider]  Ascorbic Acid (VITA-C PO) Take 1 capsule by mouth daily.     [provider]  aspirin 81 MG chewable tablet Chew 81 mg by mouth daily.    [provider]  Cholecalciferol (VITAMIN D PO) Take 1 capsule by mouth daily.     [provider]  Coenzyme Q10 (COQ-10 PO) Take by mouth.    [provider]  Hylan (SYNVISC ONE IX) Inject into the articular space.    [provider]  Magnesium 500 MG CAPS Take 500 mg by mouth daily.    [provider]  Multiple Vitamins-Minerals (MULTIVITAL PO) Take by mouth.    [provider]  predniSONE (DELTASONE) 20 MG tablet Take one tab by mouth twice daily for 4 days, then one daily for 3 days. Take with food. 05/23/19   Kandra Nicolas, MD  Probiotic Product (PROBIOTIC-10 PO) Take 1 capsule by mouth daily.    [provider]    Family History Family History  Problem Relation Age of Onset  . Diabetes Mother   . Hypertension Mother   . Hyperlipidemia Mother   . Diabetes Maternal Aunt   . Hyperlipidemia Maternal Aunt   . Hypertension Maternal Aunt   . Diabetes Other        uncle  . Diabetes Cousin   . Colon cancer Paternal Grandmother   . Cancer Paternal Grandmother        colon  . Colon cancer Maternal Grandmother   . Lung cancer Other        grandfather  . Stroke Paternal Grandfather        dementia    Social History Social History   Tobacco Use  . Smoking status: Never Smoker  . Smokeless tobacco: Never Used  Substance Use Topics  . Alcohol use: No    Comment: rare  . Drug use: No     Allergies   Patient has no known allergies.   Review of Systems Review of Systems  Constitutional: Negative for chills, diaphoresis, fatigue and fever.  HENT:  Negative for trouble swallowing.   Respiratory: Positive for chest tightness. Negative for cough, shortness of breath, wheezing and stridor.   Cardiovascular: Positive for chest pain. Negative for palpitations, claudication, leg swelling, syncope and PND.  Gastrointestinal: Negative for abdominal pain, heartburn and nausea.  Musculoskeletal: Negative for back pain.  Skin: Negative for rash.  Neurological: Negative for dizziness and light-headedness.  All other systems reviewed and are negative.    Physical Exam Triage Vital Signs ED Triage Vitals  Enc Vitals Group     BP 05/23/19 1448 (!) 159/104     Pulse Rate 05/23/19 1448 95     Resp 05/23/19 1448 18     Temp 05/23/19 1448 99.1 F (37.3 C)     Temp Source 05/23/19 1448 Oral     SpO2 05/23/19 1448 99 %  Weight --      Height --      Head Circumference --      Peak Flow --      Pain Score 05/23/19 1447 5     Pain Loc --      Pain Edu? --      Excl. in [place]? --    No data found.  Updated Vital Signs BP (!) 159/104 (BP Location: Right Arm)   Pulse 95   Temp 99.1 F (37.3 C) (Oral)   Resp 18   LMP 03/04/2012   SpO2 99%   Visual Acuity Right Eye Distance:   Left Eye Distance:   Bilateral Distance:    Right Eye Near:   Left Eye Near:    Bilateral Near:     Physical Exam Vitals and nursing note reviewed.  Constitutional:      General: She is not in acute distress.    Appearance: She is obese.  HENT:     Head: Normocephalic.     Nose: Nose normal.  Eyes:     Pupils: Pupils are equal, round, and reactive to light.  Cardiovascular:     Rate and Rhythm: Normal rate.     Heart sounds: Normal heart sounds.  Pulmonary:     Breath sounds: Normal breath sounds.  Chest:     Chest wall: Tenderness present. No crepitus.       Comments: Chest:  Distinct tenderness to palpation over the superior and mid-sternum. Palpation there recreates her pain. Abdominal:     Tenderness: There is no abdominal tenderness.    Musculoskeletal:        General: No tenderness.     Cervical back: Normal range of motion.     Right lower leg: No tenderness. No edema.     Left lower leg: No tenderness. No edema.  Skin:    General: Skin is warm and dry.     Findings: No rash.  Neurological:     Mental Status: She is alert.      UC Treatments / Results  Labs (all labs ordered are listed, but only abnormal results are displayed) Labs Reviewed - No data to display  EKG  Rate:  91 BPM PR:  160 msec QT:  362 msec QTcH:  445 msec QRSD:  86 msec QRS axis:  39 degrees Interpretation:   No acute changes; normal sinus rhythm.  Review of past records:  EKG essentially unchanged from EKG done 29 Nov 2016.  Radiology DG Chest 2 View  Result Date: 05/23/2019 CLINICAL DATA:  Chest pain EXAM: CHEST - 2 VIEW COMPARISON:  None. FINDINGS: Lungs are clear. Heart size and pulmonary vascularity are normal. No adenopathy. No pneumothorax. No bone lesions. IMPRESSION: No abnormality noted. Electronically Signed   By: Lowella Grip III M.D.   On: 05/23/2019 15:37    Procedures Procedures (including critical care time)  Medications Ordered in UC Medications - No data to display  Initial Impression / Assessment and Plan / UC Course  I have reviewed the triage vital signs and the nursing notes.  Pertinent labs & imaging results that were available during my care of the patient were reviewed by me and considered in my medical decision making (see chart for details).    Negative chest X-ray and EKG reassuring.  Begin prednisone burst/taper. Followup with Dr. Aundria Mems ([facility]) if not improving about two weeks.    Note elevated BP today because of medication non-compliance.   Final  Clinical Impressions(s) / UC Diagnoses   Final diagnoses:  Costochondritis     Discharge Instructions     Apply ice pack to sternum for 20 to 30 minutes, 3 to 4 times daily  Continue until pain and swelling  decrease.  May take Tylenol at bedtime for pain.  Please resume taking your Hydrodiuril for blood pressure.  Please monitor your blood pressure several times weekly, at different times of the day, record on a calendar with times of measurement and take this record to your Family Doctor.     ED Prescriptions    Medication Sig Dispense Auth. Provider   predniSONE (DELTASONE) 20 MG tablet Take one tab by mouth twice daily for 4 days, then one daily for 3 days. Take with food. 12 tablet Kandra Nicolas, MD        Kandra Nicolas, MD 05/25/19 1318

## 2019-05-23 NOTE — Discharge Instructions (Addendum)
Apply ice pack to sternum for 20 to 30 minutes, 3 to 4 times daily  Continue until pain and swelling decrease.  May take Tylenol at bedtime for pain.  Please resume taking your Hydrodiuril for blood pressure.  Please monitor your blood pressure several times weekly, at different times of the day, record on a calendar with times of measurement and take this record to your Family Doctor.

## 2019-05-23 NOTE — ED Triage Notes (Signed)
Patient presents to Urgent Care with complaints of centralized CP since two days ago. Patient reports it feels like pressure, began after a walk outside. Patient denies SOB, N/V/D, or cardiac hx, does have htn.

## 2020-01-23 ENCOUNTER — Other Ambulatory Visit: Payer: BC Managed Care – PPO

## 2021-08-12 IMAGING — DX DG CHEST 2V
2 series · 2 of 2 positions shown · non-contrast
Comparison: None.

CLINICAL DATA: Chest pain

EXAM:
CHEST - 2 VIEW

[chest pa]
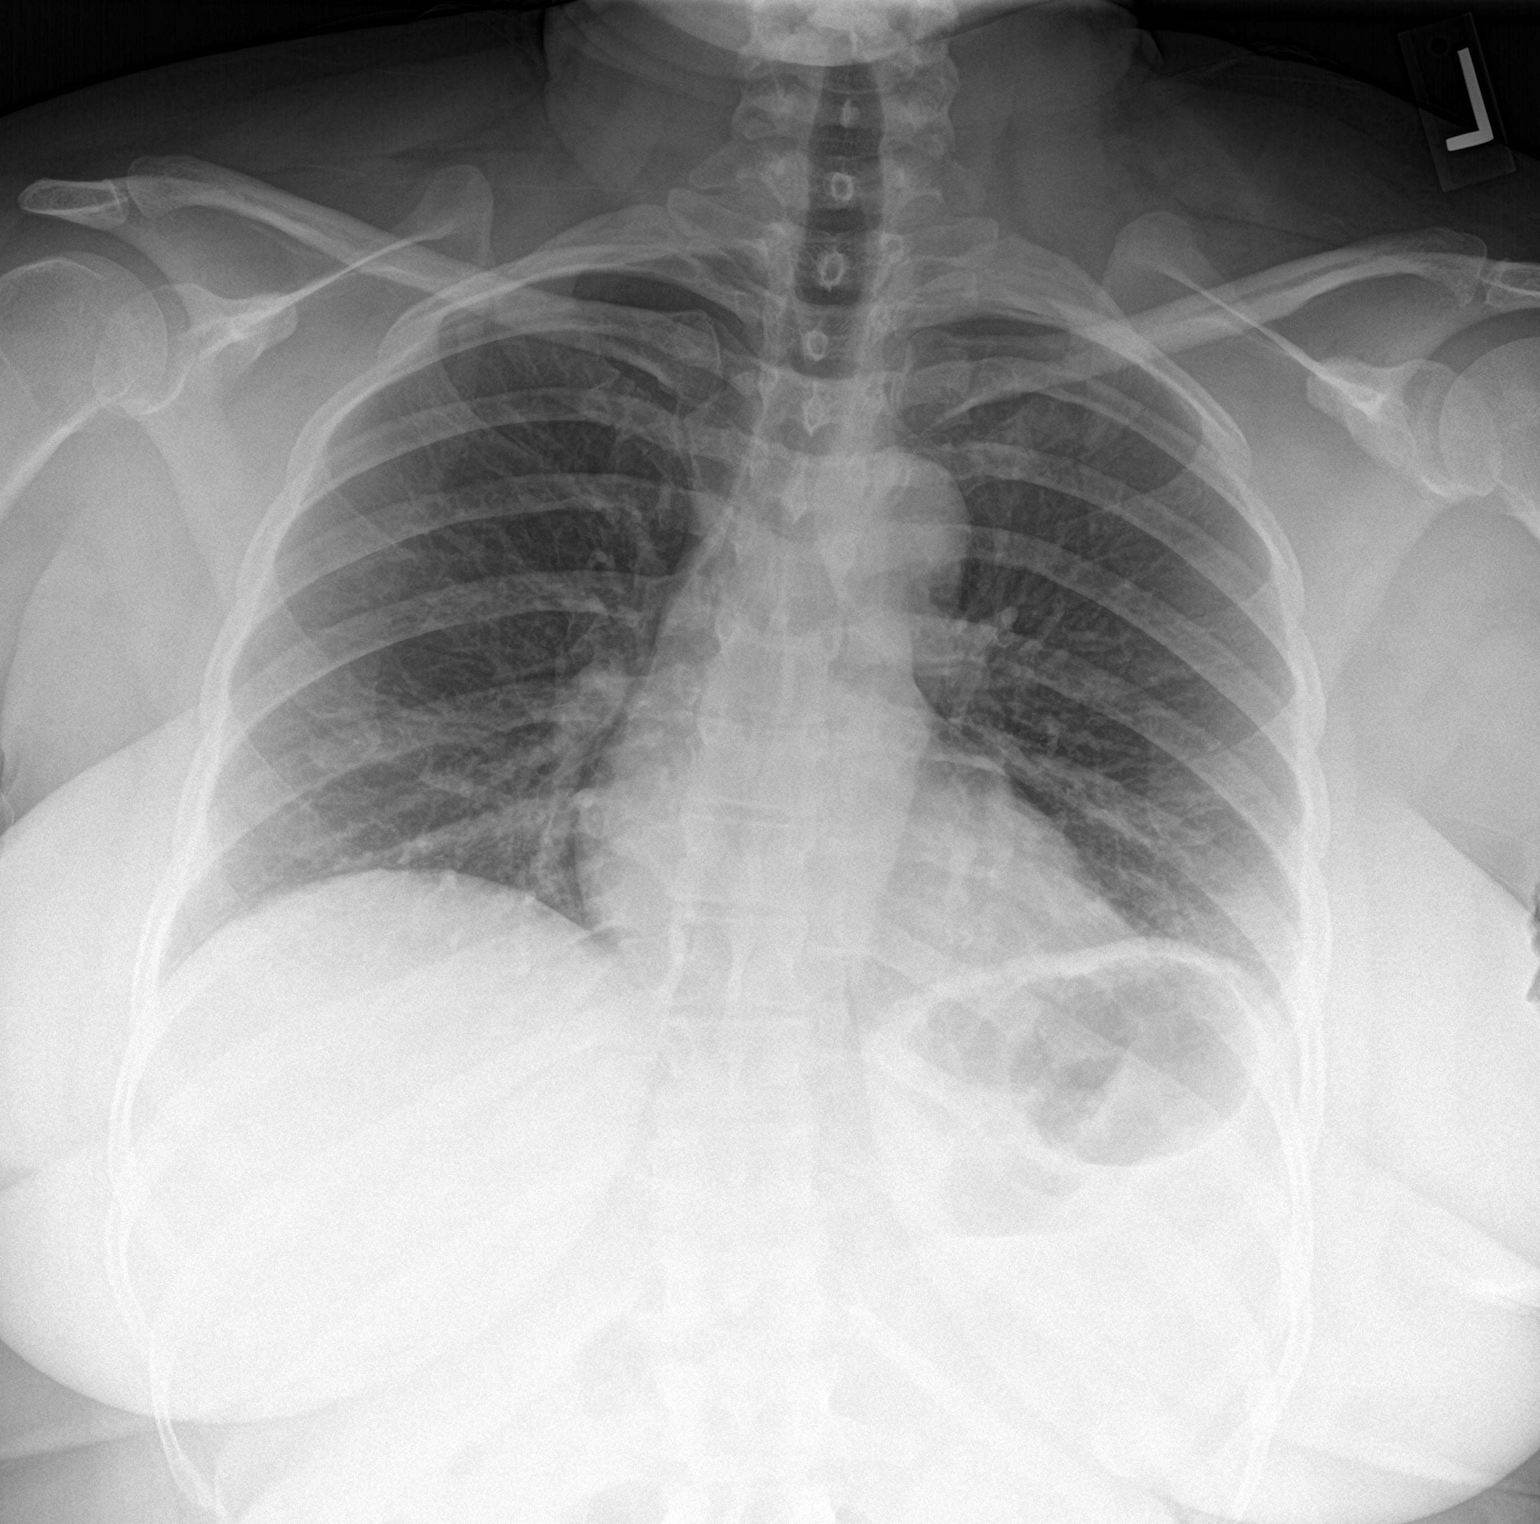

[chest lat]
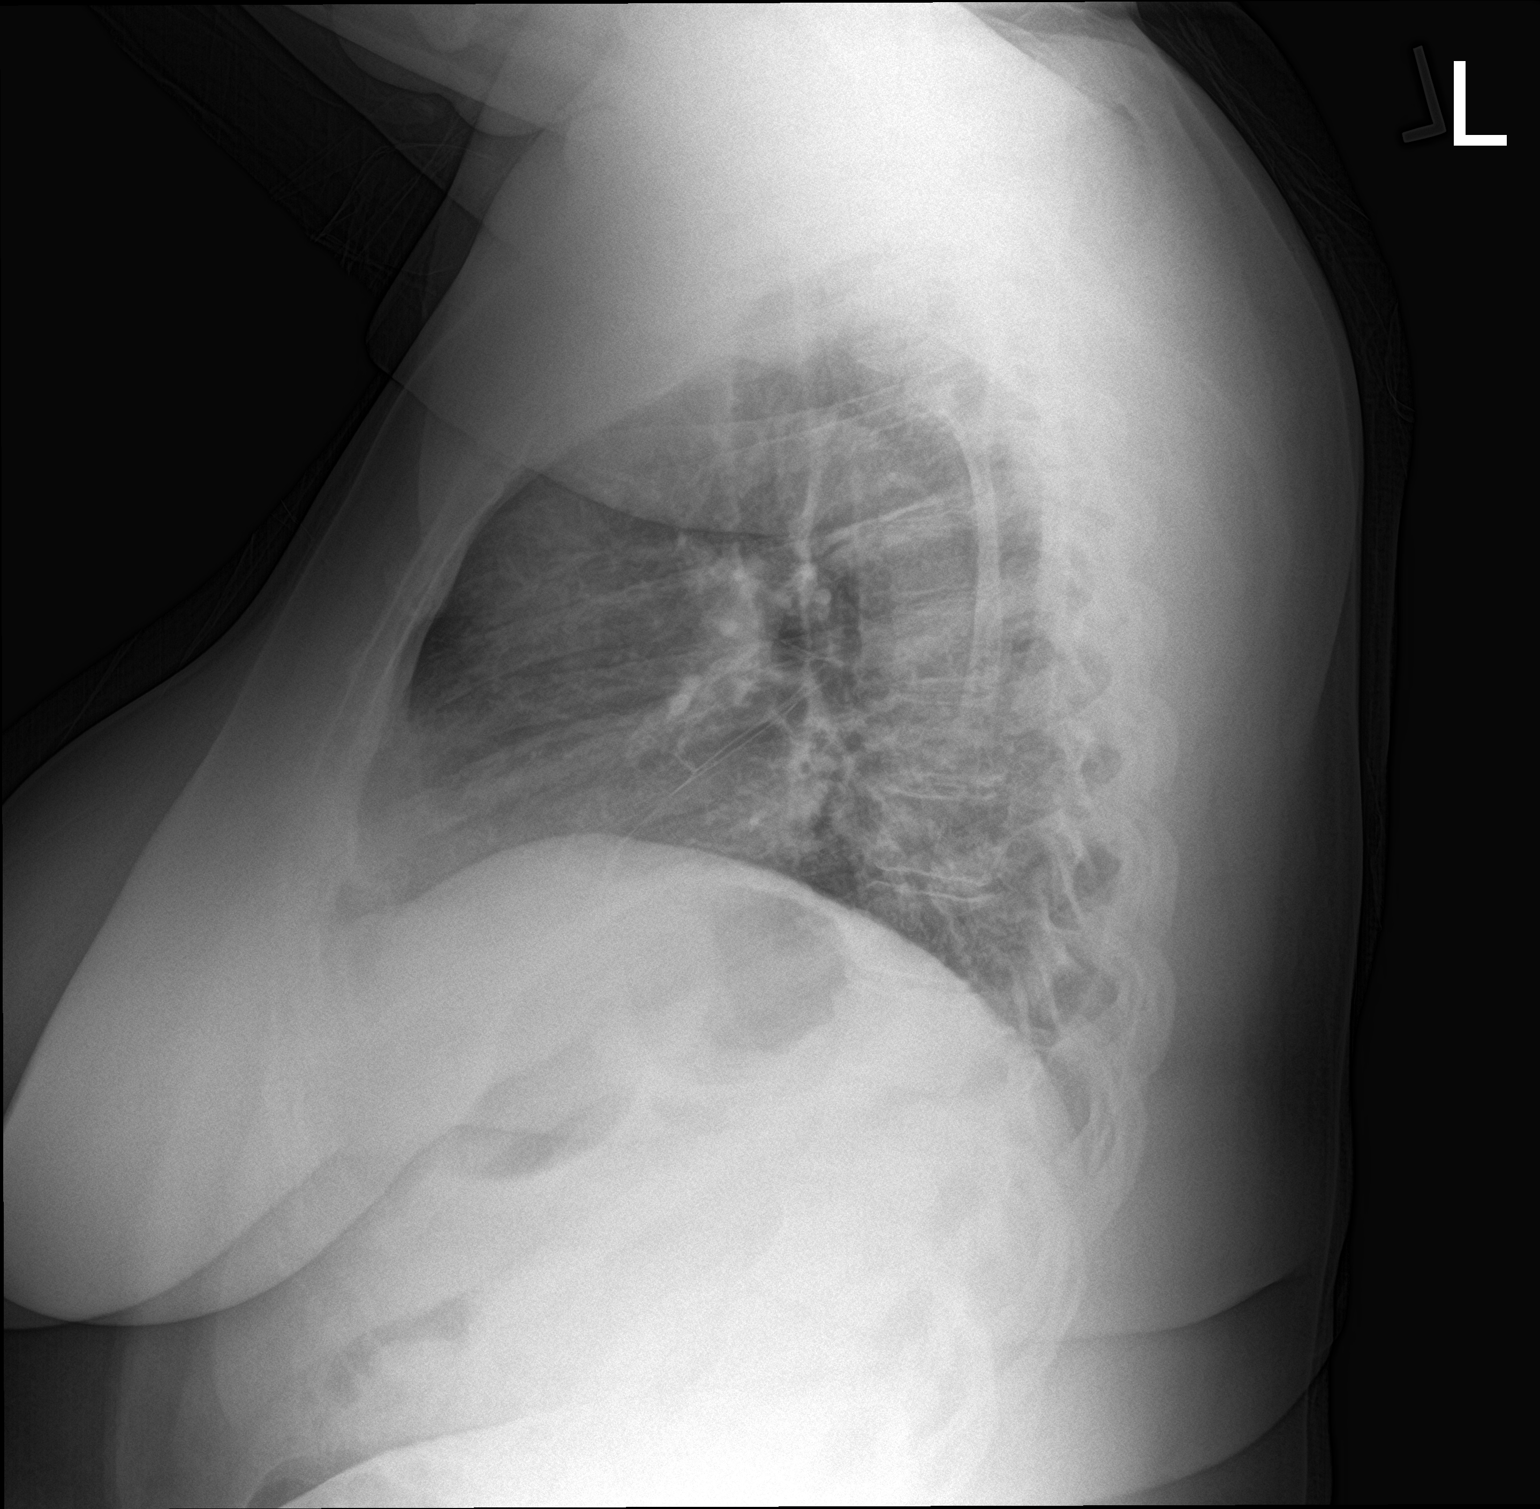

[2 of 2 positions shown; findings below may reference images not displayed]

FINDINGS: Lungs are clear. Heart size and pulmonary vascularity are normal. No
adenopathy. No pneumothorax. No bone lesions.
IMPRESSION: No abnormality noted.

## 2022-02-22 ENCOUNTER — Ambulatory Visit: Admit: 2022-02-22 | Discharge status: Absent without leave | Payer: BC Managed Care – PPO

## 2022-02-23 ENCOUNTER — Ambulatory Visit (INDEPENDENT_AMBULATORY_CARE_PROVIDER_SITE_OTHER): Payer: BC Managed Care – PPO

## 2022-02-23 ENCOUNTER — Ambulatory Visit
Admission: EM | Admit: 2022-02-23 | Discharge: 2022-02-23 | Disposition: A | Discharge status: Home / Self Care | Payer: BC Managed Care – PPO

## 2022-02-23 DIAGNOSIS — M545 Low back pain, unspecified: Secondary | ICD-10-CM | POA: Diagnosis not present

## 2022-02-23 DIAGNOSIS — M25532 Pain in left wrist: Secondary | ICD-10-CM

## 2022-02-23 MED ORDER — BACLOFEN 10 MG PO TABS: 10.0000 mg | ORAL_TABLET | Freq: Three times a day (TID) | ORAL | 0 refills | Status: DC

## 2022-02-23 MED ORDER — CELECOXIB 100 MG PO CAPS: 100.0000 mg | ORAL_CAPSULE | Freq: Two times a day (BID) | ORAL | 0 refills | Status: AC

## 2022-02-23 NOTE — Discharge Instructions (Addendum)
Advised patient to take medication as directed with food to completion.  Advised may take baclofen daily or as needed for accompanying muscle spasms of lower back.  Encouraged patient to increase daily water intake to 64 ounces per day while taking these medications.  Advised if symptoms worsen and/or unresolved please follow-up with PCP or [place] sports medicine provider for further evaluation.  Contact information is below.
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
## 2022-04-04 ENCOUNTER — Ambulatory Visit (INDEPENDENT_AMBULATORY_CARE_PROVIDER_SITE_OTHER): Payer: BC Managed Care – PPO | Admitting: Family Medicine

## 2022-04-04 VITALS — BP 120/82 | Ht 64.0 in | Wt 193.0 lb

## 2022-04-04 DIAGNOSIS — M5416 Radiculopathy, lumbar region: Secondary | ICD-10-CM | POA: Insufficient documentation

## 2022-04-04 DIAGNOSIS — S39012A Strain of muscle, fascia and tendon of lower back, initial encounter: Secondary | ICD-10-CM | POA: Insufficient documentation

## 2022-04-04 NOTE — Assessment & Plan Note (Signed)
Acutely occurring.  She has irritation and extension.  No suggestion of nerve impingement at this time. -Counseled on home exercise therapy and supportive care. -Counseled on baclofen and Celebrex. -Referral to physical therapy. -Could consider further imaging

## 2022-04-04 NOTE — Patient Instructions (Signed)
Nice to meet you  Please try heat  Please try the exercises  We have made a referral to physical therapy  Please send me a message in MyChart with any questions or updates.  Please see me back in 3-4 weeks.   --Dr. Raeford Razor
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
## 2022-04-05 ENCOUNTER — Encounter: Payer: Self-pay | Admitting: Family Medicine

## 2022-04-06 ENCOUNTER — Other Ambulatory Visit: Payer: Self-pay | Admitting: Family Medicine

## 2022-04-06 MED ORDER — CELECOXIB 200 MG PO CAPS: 200.0000 mg | ORAL_CAPSULE | Freq: Two times a day (BID) | ORAL | 1 refills | Status: DC | PRN

## 2022-04-06 MED ORDER — BACLOFEN 10 MG PO TABS: 10.0000 mg | ORAL_TABLET | Freq: Three times a day (TID) | ORAL | 1 refills | Status: DC | PRN

## 2022-05-02 ENCOUNTER — Ambulatory Visit (INDEPENDENT_AMBULATORY_CARE_PROVIDER_SITE_OTHER): Payer: BC Managed Care – PPO | Admitting: Family Medicine

## 2022-05-02 ENCOUNTER — Encounter: Payer: Self-pay | Admitting: Family Medicine

## 2022-05-02 VITALS — BP 134/64 | Ht 64.0 in | Wt 194.0 lb

## 2022-05-02 DIAGNOSIS — M5416 Radiculopathy, lumbar region: Secondary | ICD-10-CM | POA: Diagnosis not present

## 2022-05-02 NOTE — Patient Instructions (Signed)
Good to see you Please continue physical therapy  We'll get the MRI at the [facility] Please send me a message in [place] with any questions or updates.  We'll setup a virtual visit once the MRI is resulted.   --Dr. Raeford Razor
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
## 2022-05-14 ENCOUNTER — Ambulatory Visit (INDEPENDENT_AMBULATORY_CARE_PROVIDER_SITE_OTHER): Payer: BC Managed Care – PPO

## 2022-05-14 DIAGNOSIS — M5416 Radiculopathy, lumbar region: Secondary | ICD-10-CM | POA: Diagnosis not present

## 2022-05-18 ENCOUNTER — Telehealth (INDEPENDENT_AMBULATORY_CARE_PROVIDER_SITE_OTHER): Payer: BC Managed Care – PPO | Admitting: Family Medicine

## 2022-05-18 VITALS — Ht 64.0 in | Wt 194.0 lb

## 2022-05-18 DIAGNOSIS — M5416 Radiculopathy, lumbar region: Secondary | ICD-10-CM

## 2022-05-18 MED ORDER — PREDNISONE 5 MG PO TABS: ORAL_TABLET | ORAL | 0 refills | Status: AC
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
## 2022-06-06 ENCOUNTER — Encounter: Payer: Self-pay | Admitting: *Deleted

## 2022-06-25 ENCOUNTER — Other Ambulatory Visit: Payer: Self-pay | Admitting: Family Medicine

## 2022-06-26 ENCOUNTER — Encounter: Payer: Self-pay | Admitting: Family Medicine

## 2022-06-27 MED ORDER — BACLOFEN 10 MG PO TABS: 10.0000 mg | ORAL_TABLET | Freq: Three times a day (TID) | ORAL | 1 refills | Status: AC | PRN

## 2022-06-27 NOTE — Addendum Note (Signed)
Addended by: Merrilyn Puma on: 06/27/2022 09:17 AM   Modules accepted: Orders
